# Patient Record
Sex: Female | Born: 1965 | Race: White | Hispanic: No | Marital: Married | Smoking: Never smoker
Health system: Southern US, Community
[De-identification: ages and names within clinical notes are randomized; demographics above are authoritative.]

## PROBLEM LIST (undated history)

## (undated) DIAGNOSIS — I10 Essential (primary) hypertension: Secondary | ICD-10-CM

## (undated) HISTORY — PX: TUBAL LIGATION: SHX77

---

## 1989-11-29 HISTORY — PX: TUBAL LIGATION: SHX77

## 2010-04-20 ENCOUNTER — Emergency Department (HOSPITAL_COMMUNITY): Admission: EM | Admit: 2010-04-20 | Discharge: 2010-04-20 | Payer: Self-pay | Admitting: Emergency Medicine

## 2010-04-30 ENCOUNTER — Encounter (INDEPENDENT_AMBULATORY_CARE_PROVIDER_SITE_OTHER): Payer: Self-pay | Admitting: *Deleted

## 2010-12-31 NOTE — Letter (Signed)
Summary: Appointment - Reschedule  [employer], Main Office  [address]   [place], [place] [address]   Phone: [phone]  Fax: [phone]     April 30, 2010 MRN: [id number]   Angela Vaughan [address] [place], [place]  [address]   Dear [name],   Due to a change in our office schedule, your appointment on 05/05/2010 at 8:15am must be changed.  It is very important that we reach you to reschedule this appointment. We look forward to participating in your health care needs. Please contact us at the number listed above at your earliest convenience to reschedule this appointment.     Sincerely,   [facility] Scheduling Team

## 2011-02-15 LAB — POCT I-STAT, CHEM 8
Calcium, Ion: 1.1 mmol/L — ABNORMAL LOW (ref 1.12–1.32)
Chloride: 105 mEq/L (ref 96–112)
HCT: 33 % — ABNORMAL LOW (ref 36.0–46.0)
Hemoglobin: 11.2 g/dL — ABNORMAL LOW (ref 12.0–15.0)
TCO2: 21 mmol/L (ref 0–100)

## 2011-02-15 LAB — POCT CARDIAC MARKERS

## 2011-03-24 ENCOUNTER — Emergency Department (HOSPITAL_COMMUNITY)
Admission: EM | Admit: 2011-03-24 | Discharge: 2011-03-24 | Disposition: A | Discharge status: Home / Self Care | Payer: Self-pay | Attending: Emergency Medicine | Admitting: Emergency Medicine

## 2011-03-24 ENCOUNTER — Emergency Department (HOSPITAL_COMMUNITY): Payer: Self-pay

## 2011-03-24 DIAGNOSIS — M25539 Pain in unspecified wrist: Secondary | ICD-10-CM | POA: Insufficient documentation

## 2011-03-24 DIAGNOSIS — Y929 Unspecified place or not applicable: Secondary | ICD-10-CM | POA: Insufficient documentation

## 2011-03-24 DIAGNOSIS — S52599A Other fractures of lower end of unspecified radius, initial encounter for closed fracture: Secondary | ICD-10-CM | POA: Insufficient documentation

## 2012-03-05 ENCOUNTER — Encounter (HOSPITAL_COMMUNITY): Payer: Self-pay | Admitting: Emergency Medicine

## 2012-03-05 ENCOUNTER — Emergency Department (HOSPITAL_COMMUNITY)
Admission: EM | Admit: 2012-03-05 | Discharge: 2012-03-05 | Discharge status: Against Medical Advice | Payer: Self-pay | Attending: Emergency Medicine | Admitting: Emergency Medicine

## 2012-03-05 DIAGNOSIS — R109 Unspecified abdominal pain: Secondary | ICD-10-CM | POA: Insufficient documentation

## 2012-03-05 LAB — URINALYSIS, ROUTINE W REFLEX MICROSCOPIC
Glucose, UA: NEGATIVE mg/dL
Protein, ur: NEGATIVE mg/dL
Specific Gravity, Urine: 1.04 — ABNORMAL HIGH (ref 1.005–1.030)

## 2012-03-05 LAB — URINE MICROSCOPIC-ADD ON

## 2012-03-05 NOTE — ED Notes (Signed)
C/o intermittent mid upper abd pain with swelling and tenderness x approx 2 months.  States she has problems digesting food.

## 2012-03-05 NOTE — ED Notes (Addendum)
Attempted to complete vital signs on pt. Called pt several times with no answer. Will continue to monitor.

## 2012-06-26 ENCOUNTER — Encounter (HOSPITAL_COMMUNITY): Payer: Self-pay | Admitting: Neurology

## 2012-06-26 ENCOUNTER — Emergency Department (HOSPITAL_COMMUNITY)
Admission: EM | Admit: 2012-06-26 | Discharge: 2012-06-26 | Disposition: A | Discharge status: Home / Self Care | Payer: Self-pay | Attending: Emergency Medicine | Admitting: Emergency Medicine

## 2012-06-26 DIAGNOSIS — K279 Peptic ulcer, site unspecified, unspecified as acute or chronic, without hemorrhage or perforation: Secondary | ICD-10-CM | POA: Insufficient documentation

## 2012-06-26 DIAGNOSIS — K2970 Gastritis, unspecified, without bleeding: Secondary | ICD-10-CM | POA: Insufficient documentation

## 2012-06-26 DIAGNOSIS — N63 Unspecified lump in unspecified breast: Secondary | ICD-10-CM | POA: Insufficient documentation

## 2012-06-26 MED ORDER — GI COCKTAIL ~~LOC~~
30.0000 mL | Freq: Once | ORAL | Status: AC
  Administered 2012-06-26: 30 mL via ORAL
  Filled 2012-06-26: qty 30

## 2012-06-26 MED ORDER — OXYCODONE-ACETAMINOPHEN 5-325 MG PO TABS: 1.0000 | ORAL_TABLET | Freq: Four times a day (QID) | ORAL | Status: AC | PRN

## 2012-06-26 NOTE — ED Notes (Signed)
Pt c/o LUQ pain x 1 month, constant pain, no relief, nothing relieves pain. Pain has been treating pain with antacids. Pt reporting n/v/d. Pt a x 4. Pt also c/o lump on right breast she would like to have examined. NAD

## 2012-06-26 NOTE — ED Provider Notes (Signed)
History     CSN: [id number]  Arrival date & time 06/26/12  4098   First MD Initiated Contact with Patient 06/26/12 0831      Chief Complaint  Patient presents with  . Abdominal Pain    (Consider location/radiation/quality/duration/timing/severity/associated sxs/prior treatment) Patient is a 46 y.o. female presenting with abdominal pain. The history is provided by the patient.  Abdominal Pain The primary symptoms of the illness include abdominal pain and nausea. The primary symptoms of the illness do not include fever, shortness of breath, vomiting, diarrhea, dysuria or vaginal discharge. Episode onset: One month ago. The onset of the illness was gradual. The problem has been gradually worsening.  The abdominal pain is located in the epigastric region. The abdominal pain does not radiate. The severity of the abdominal pain is 8/10. The abdominal pain is relieved by nothing (Has tried Zantac but no improvement). The abdominal pain is exacerbated by eating.  Associated with: States occasional NSAID and aspirin use. Denies alcohol. The patient states that she believes she is currently not pregnant. The patient has not had a change in bowel habit. Additional symptoms associated with the illness include anorexia and back pain. Symptoms associated with the illness do not include constipation, urgency or frequency. Significant associated medical issues do not include PUD or GERD.    History reviewed. No pertinent past medical history.  Past Surgical History  Procedure Date  . Tubal ligation     No family history on file.  History  Substance Use Topics  . Smoking status: Never Smoker   . Smokeless tobacco: Not on file  . Alcohol Use: No    OB History    Grav Para Term Preterm Abortions TAB SAB Ect Mult Living                  Review of Systems  Constitutional: Negative for fever.  Respiratory: Negative for shortness of breath.        Pain with a knot in the right breast for the  last 6-9 months  Gastrointestinal: Positive for nausea, abdominal pain and anorexia. Negative for vomiting, diarrhea and constipation.  Genitourinary: Negative for dysuria, urgency, frequency and vaginal discharge.  Musculoskeletal: Positive for back pain.  All other systems reviewed and are negative.    Allergies  Review of patient's allergies indicates no known allergies.  Home Medications  No current outpatient prescriptions on file.  BP 139/86  Pulse 80  Temp 98.5 F (36.9 C) (Oral)  Resp 18  SpO2 98%  LMP 06/25/2012  Physical Exam  Nursing note and vitals reviewed. Constitutional: She is oriented to person, place, and time. She appears well-developed and well-nourished. No distress.  HENT:  Head: Normocephalic and atraumatic.  Mouth/Throat: Oropharynx is clear and moist.  Eyes: Conjunctivae and EOM are normal. Pupils are equal, round, and reactive to light.  Neck: Normal range of motion. Neck supple.  Cardiovascular: Normal rate, regular rhythm and intact distal pulses.   No murmur heard. Pulmonary/Chest: Effort normal and breath sounds normal. No respiratory distress. She has no wheezes. She has no rales. Right breast exhibits tenderness.    Abdominal: Soft. Normal appearance. She exhibits no distension. There is tenderness in the epigastric area. There is guarding. There is no rigidity, no rebound, no CVA tenderness, no tenderness at McBurney's point and negative Murphy's sign. No hernia.  Musculoskeletal: Normal range of motion. She exhibits no edema and no tenderness.  Neurological: She is alert and oriented to person, place, and time.  Skin: Skin is warm and dry. No rash noted. No erythema.  Psychiatric: She has a normal mood and affect. Her behavior is normal.    ED Course  Procedures (including critical care time)  Labs Reviewed - No data to display No results found.   1. Gastritis   2. Peptic ulcer disease   3. Breast lump       MDM   Patient  with symptoms classic for gastritis versus peptic ulcer disease. The pain has been worsening over the last month and is only in the epigastric region. On exam she only has epigastric pain with no left upper quadrant or right upper quadrant pain concerning for pancreatitis or cholecystitis. Patient states the pain is worse with eating but that now it's starting to hurt even when not eating. She has been taking Zantac without relief but denies any vomiting or change in bowel habits. She is well-appearing on exam with pain in her epigastric region.  Patient given a GI cocktail but do not feel that labs or imaging is needed at this time. Discussed with patient about starting Prilosec because she has no medical insurance and can't afford more expensive PPIs. She will continue Zantac and start Prilosec. Symptoms were improved after GI cocktail. She was given referral to GI and discussed with her stopping all NSAIDs and aspirin products.  Secondly patient has noticed a knot in her right breast for the last 6-9 months. Occasionally it will become sore and resolved. On exam she has a very small dime-sized mobile lesion.  There are no external signs of abnormalities. Patient was further referred to the breast Center for mammogram  however feel most likely simple cyst.   H.pylori done and will contact the pt tomorrow if positive [phone]   3:11 PM H.pylori neg.  Gwyneth Sprout, MD 06/27/12 1511

## 2013-07-09 ENCOUNTER — Inpatient Hospital Stay (HOSPITAL_COMMUNITY)
Admission: AD | Admit: 2013-07-09 | Discharge: 2013-07-09 | Disposition: A | Discharge status: Home / Self Care | Payer: Self-pay | Source: Ambulatory Visit | Attending: Obstetrics & Gynecology | Admitting: Obstetrics & Gynecology

## 2013-07-09 ENCOUNTER — Encounter (HOSPITAL_COMMUNITY): Payer: Self-pay | Admitting: *Deleted

## 2013-07-09 DIAGNOSIS — R112 Nausea with vomiting, unspecified: Secondary | ICD-10-CM | POA: Insufficient documentation

## 2013-07-09 DIAGNOSIS — N920 Excessive and frequent menstruation with regular cycle: Secondary | ICD-10-CM | POA: Insufficient documentation

## 2013-07-09 DIAGNOSIS — D649 Anemia, unspecified: Secondary | ICD-10-CM | POA: Insufficient documentation

## 2013-07-09 DIAGNOSIS — R1013 Epigastric pain: Secondary | ICD-10-CM | POA: Insufficient documentation

## 2013-07-09 DIAGNOSIS — K219 Gastro-esophageal reflux disease without esophagitis: Secondary | ICD-10-CM | POA: Insufficient documentation

## 2013-07-09 HISTORY — DX: Essential (primary) hypertension: I10

## 2013-07-09 LAB — LIPASE, BLOOD: Lipase: 53 U/L (ref 11–59)

## 2013-07-09 LAB — COMPREHENSIVE METABOLIC PANEL
ALT: 10 U/L (ref 0–35)
AST: 15 U/L (ref 0–37)
Alkaline Phosphatase: 51 U/L (ref 39–117)
CO2: 22 mEq/L (ref 19–32)
Chloride: 105 mEq/L (ref 96–112)
GFR calc non Af Amer: >90 mL/min (ref >90)
Glucose, Bld: 92 mg/dL (ref 70–99)
Potassium: 3.9 mEq/L (ref 3.5–5.1)
Sodium: 135 mEq/L (ref 135–145)
Total Bilirubin: 0.1 mg/dL — ABNORMAL LOW (ref 0.3–1.2)

## 2013-07-09 LAB — CBC
HCT: 27.5 % — ABNORMAL LOW (ref 36.0–46.0)
MCH: 24.3 pg — ABNORMAL LOW (ref 26.0–34.0)
MCHC: 31.6 g/dL (ref 30.0–36.0)
RDW: 13.7 % (ref 11.5–15.5)
WBC: 6.1 10*3/uL (ref 4.0–10.5)

## 2013-07-09 LAB — URINE MICROSCOPIC-ADD ON

## 2013-07-09 LAB — URINALYSIS, ROUTINE W REFLEX MICROSCOPIC
Glucose, UA: NEGATIVE mg/dL
Specific Gravity, Urine: >1.03 — ABNORMAL HIGH (ref 1.005–1.030)
Urobilinogen, UA: 0.2 mg/dL (ref 0.0–1.0)

## 2013-07-09 LAB — POCT PREGNANCY, URINE: Preg Test, Ur: NEGATIVE

## 2013-07-09 MED ORDER — OMEPRAZOLE 20 MG PO CPDR: 20.0000 mg | DELAYED_RELEASE_CAPSULE | Freq: Two times a day (BID) | ORAL | Status: DC

## 2013-07-09 MED ORDER — ONDANSETRON 8 MG PO TBDP
8.0000 mg | ORAL_TABLET | Freq: Once | ORAL | Status: AC
  Administered 2013-07-09: 8 mg via ORAL
  Filled 2013-07-09: qty 1

## 2013-07-09 MED ORDER — DOCUSATE SODIUM 100 MG PO CAPS: 100.0000 mg | ORAL_CAPSULE | Freq: Two times a day (BID) | ORAL | Status: DC

## 2013-07-09 MED ORDER — FERROUS SULFATE 325 (65 FE) MG PO TABS: 325.0000 mg | ORAL_TABLET | Freq: Every day | ORAL | Status: DC

## 2013-07-09 MED ORDER — METOCLOPRAMIDE HCL 10 MG PO TABS
10.0000 mg | ORAL_TABLET | Freq: Once | ORAL | Status: AC
  Administered 2013-07-09: 10 mg via ORAL
  Filled 2013-07-09: qty 1

## 2013-07-09 MED ORDER — GI COCKTAIL ~~LOC~~
30.0000 mL | Freq: Once | ORAL | Status: AC
  Administered 2013-07-09: 30 mL via ORAL
  Filled 2013-07-09: qty 30

## 2013-07-09 NOTE — MAU Provider Note (Signed)
History     CSN: [id number]  Arrival date and time: 07/09/13 1033   First Provider Initiated Contact with Patient 07/09/13 1110      Chief Complaint  Patient presents with  . Abdominal Pain   HPI Ms. Angela Vaughan is a 47 y.o. G2P2001 who presents to MAU today with complaint of epigastric pain. The patient states a history of pelvic ulcer disease x years. She has had N/V yesterday and still nausea this morning. She states that the pain is a burning pain. She denies diarrhea, but has frequent constipation. She denies vaginal bleeding, discharge or fever. She does state a recent history of prolonged heavy periods lasting ~ 10 days each month. She denies weakness, fatigue or dizziness. She is not bleeding today. LMP was 06/15/13. She denies pain medications, but has been taking Zantac and Pepcid frequently as she was told 2 years ago in the [facility] that she could take it as much as she wanted. She rates her pain at 8/10. Patient also states issues with slow digestion. States that she went for an upper endoscopy years ago and was told that there was food in her stomach from the previous week. She denies any history of DM.    OB History   Grav Para Term Preterm Abortions TAB SAB Ect Mult Living   2 2 2       1       Past Medical History  Diagnosis Date  . Hypertension     Past Surgical History  Procedure Laterality Date  . Tubal ligation    . Tubal ligation  1991    History reviewed. No pertinent family history.  History  Substance Use Topics  . Smoking status: Never Smoker   . Smokeless tobacco: Not on file  . Alcohol Use: No    Allergies: No Known Allergies  No prescriptions prior to admission    Review of Systems  Constitutional: Negative for fever and malaise/fatigue.  Gastrointestinal: Positive for heartburn, nausea, vomiting, abdominal pain and constipation. Negative for diarrhea, blood in stool and melena.  Genitourinary: Negative for dysuria, urgency and frequency.       Neg - vaginal bleeding, discharge  Musculoskeletal: Positive for back pain.   Physical Exam   Blood pressure 129/89, pulse 71, temperature 98 F (36.7 C), temperature source Oral, resp. rate 18, height 5\' 5"  (1.651 m), weight 213 lb 12.8 oz (96.979 kg), last menstrual period 06/15/2013.  Physical Exam  Constitutional: She is oriented to person, place, and time. She appears well-developed and well-nourished. No distress.  HENT:  Head: Normocephalic and atraumatic.  Cardiovascular: Normal rate, regular rhythm and normal heart sounds.   Respiratory: Effort normal and breath sounds normal. No respiratory distress.  GI: Soft. Bowel sounds are normal. She exhibits no distension and no mass. There is tenderness (modeate tenderness to palpation of the epigastric region). There is no rebound and no guarding.  Neurological: She is alert and oriented to person, place, and time.  Skin: Skin is warm and dry. No erythema.  Psychiatric: She has a normal mood and affect.   Results for orders placed during the hospital encounter of 07/09/13 (from the past 24 hour(s))  URINALYSIS, ROUTINE W REFLEX MICROSCOPIC     Status: Abnormal   Collection Time    07/09/13 10:50 AM      Result Value Range   Color, Urine YELLOW  YELLOW   APPearance CLEAR  CLEAR   Specific Gravity, Urine >1.030 (*) 1.005 - 1.030  pH 5.5  5.0 - 8.0   Glucose, UA NEGATIVE  NEGATIVE mg/dL   Hgb urine dipstick TRACE (*) NEGATIVE   Bilirubin Urine NEGATIVE  NEGATIVE   Ketones, ur NEGATIVE  NEGATIVE mg/dL   Protein, ur NEGATIVE  NEGATIVE mg/dL   Urobilinogen, UA 0.2  0.0 - 1.0 mg/dL   Nitrite NEGATIVE  NEGATIVE   Leukocytes, UA SMALL (*) NEGATIVE  URINE MICROSCOPIC-ADD ON     Status: Abnormal   Collection Time    07/09/13 10:50 AM      Result Value Range   Squamous Epithelial / LPF RARE  RARE   WBC, UA 7-10  <3 WBC/hpf   Bacteria, UA FEW (*) RARE  POCT PREGNANCY, URINE     Status: None   Collection Time    07/09/13 10:54  AM      Result Value Range   Preg Test, Ur NEGATIVE  NEGATIVE  CBC     Status: Abnormal   Collection Time    07/09/13 11:45 AM      Result Value Range   WBC 6.1  4.0 - 10.5 K/uL   RBC 3.58 (*) 3.87 - 5.11 MIL/uL   Hemoglobin 8.7 (*) 12.0 - 15.0 g/dL   HCT 16.1 (*) 09.6 - 04.5 %   MCV 76.8 (*) 78.0 - 100.0 fL   MCH 24.3 (*) 26.0 - 34.0 pg   MCHC 31.6  30.0 - 36.0 g/dL   RDW 40.9  81.1 - 91.4 %   Platelets 271  150 - 400 K/uL  COMPREHENSIVE METABOLIC PANEL     Status: Abnormal   Collection Time    07/09/13 11:45 AM      Result Value Range   Sodium 135  135 - 145 mEq/L   Potassium 3.9  3.5 - 5.1 mEq/L   Chloride 105  96 - 112 mEq/L   CO2 22  19 - 32 mEq/L   Glucose, Bld 92  70 - 99 mg/dL   BUN 10  6 - 23 mg/dL   Creatinine, Ser 7.82  0.50 - 1.10 mg/dL   Calcium 8.5  8.4 - 95.6 mg/dL   Total Protein 6.1  6.0 - 8.3 g/dL   Albumin 3.0 (*) 3.5 - 5.2 g/dL   AST 15  0 - 37 U/L   ALT 10  0 - 35 U/L   Alkaline Phosphatase 51  39 - 117 U/L   Total Bilirubin 0.1 (*) 0.3 - 1.2 mg/dL   GFR calc non Af Amer >90  >90 mL/min   GFR calc Af Amer >90  >90 mL/min    MAU Course  Procedures None  MDM Reglan, Zofran ODT and GI cocktail given - patient reports significant improvement in symptoms Amylase and Lipase still pending - unlikely to be abnormal with patient's history if resolution of symptoms with treatment today, will call patient for follow-up if results are abnormal  Assessment and Plan  A: GERD Menorrhagia  Anemia  P: Discharge home Rx for prilosec, iron and collace Patient advised to follow-up with PCP for further management Patient referred to GYN clinic for management of menorrhagia. They will call the patient with an appointment Patient may return to MAU as needed, however better to go to [facility] or [facility] if symptoms persist or worsen  Freddi Starr, PA-C  07/09/2013, 3:06 PM

## 2013-07-09 NOTE — MAU Provider Note (Signed)
Attestation of Attending Supervision of Advanced Practitioner (PA/CNM/NP): Evaluation and management procedures were performed by the Advanced Practitioner under my supervision and collaboration.  I have reviewed the Advanced Practitioner's note and chart, and I agree with the management and plan.  Khamiya Varin, MD, FACOG Attending Obstetrician & Gynecologist Faculty Practice, [facility]  

## 2013-07-09 NOTE — MAU Note (Signed)
Epigastric pain "forever," for over a year.  Pain flares up, vomitting.  Severe pain with eating, does not matter what she eats.  Has not been dx'd with gallstones.

## 2013-07-10 LAB — URINE CULTURE

## 2013-08-08 ENCOUNTER — Encounter: Payer: Self-pay | Admitting: Family Medicine

## 2014-05-10 ENCOUNTER — Emergency Department (HOSPITAL_COMMUNITY)
Admission: EM | Admit: 2014-05-10 | Discharge: 2014-05-10 | Disposition: A | Discharge status: Home / Self Care | Payer: Self-pay | Attending: Emergency Medicine | Admitting: Emergency Medicine

## 2014-05-10 ENCOUNTER — Encounter (HOSPITAL_COMMUNITY): Payer: Self-pay | Admitting: Emergency Medicine

## 2014-05-10 DIAGNOSIS — Z3202 Encounter for pregnancy test, result negative: Secondary | ICD-10-CM | POA: Insufficient documentation

## 2014-05-10 DIAGNOSIS — T424X1A Poisoning by benzodiazepines, accidental (unintentional), initial encounter: Secondary | ICD-10-CM | POA: Insufficient documentation

## 2014-05-10 DIAGNOSIS — Y929 Unspecified place or not applicable: Secondary | ICD-10-CM | POA: Insufficient documentation

## 2014-05-10 DIAGNOSIS — D649 Anemia, unspecified: Secondary | ICD-10-CM | POA: Insufficient documentation

## 2014-05-10 DIAGNOSIS — I10 Essential (primary) hypertension: Secondary | ICD-10-CM | POA: Insufficient documentation

## 2014-05-10 DIAGNOSIS — Y939 Activity, unspecified: Secondary | ICD-10-CM | POA: Insufficient documentation

## 2014-05-10 DIAGNOSIS — T40605A Adverse effect of unspecified narcotics, initial encounter: Secondary | ICD-10-CM

## 2014-05-10 DIAGNOSIS — T424X4A Poisoning by benzodiazepines, undetermined, initial encounter: Secondary | ICD-10-CM | POA: Insufficient documentation

## 2014-05-10 DIAGNOSIS — Z8742 Personal history of other diseases of the female genital tract: Secondary | ICD-10-CM | POA: Insufficient documentation

## 2014-05-10 DIAGNOSIS — Z79899 Other long term (current) drug therapy: Secondary | ICD-10-CM | POA: Insufficient documentation

## 2014-05-10 DIAGNOSIS — T400X1A Poisoning by opium, accidental (unintentional), initial encounter: Secondary | ICD-10-CM | POA: Insufficient documentation

## 2014-05-10 LAB — CBC WITH DIFFERENTIAL/PLATELET
BASOS ABS: 0 10*3/uL (ref 0.0–0.1)
Basophils Relative: 0 % (ref 0–1)
EOS PCT: 0 % (ref 0–5)
Eosinophils Absolute: 0 10*3/uL (ref 0.0–0.7)
HEMATOCRIT: 30.5 % — AB (ref 36.0–46.0)
HEMOGLOBIN: 9.7 g/dL — AB (ref 12.0–15.0)
LYMPHS ABS: 1.2 10*3/uL (ref 0.7–4.0)
LYMPHS PCT: 11 % — AB (ref 12–46)
MCH: 22.9 pg — ABNORMAL LOW (ref 26.0–34.0)
MCHC: 31.8 g/dL (ref 30.0–36.0)
MCV: 72.1 fL — AB (ref 78.0–100.0)
MONO ABS: 0.8 10*3/uL (ref 0.1–1.0)
MONOS PCT: 7 % (ref 3–12)
NEUTROS ABS: 8.9 10*3/uL — AB (ref 1.7–7.7)
Neutrophils Relative %: 82 % — ABNORMAL HIGH (ref 43–77)
Platelets: 343 10*3/uL (ref 150–400)
RBC: 4.23 MIL/uL (ref 3.87–5.11)
RDW: 16.1 % — AB (ref 11.5–15.5)
WBC: 10.9 10*3/uL — AB (ref 4.0–10.5)

## 2014-05-10 LAB — POC URINE PREG, ED: Preg Test, Ur: NEGATIVE

## 2014-05-10 NOTE — ED Notes (Signed)
Bed: WA09 Expected date:  Expected time:  Means of arrival:  Comments: ems od

## 2014-05-10 NOTE — ED Provider Notes (Signed)
CSN: [id number]     Arrival date & time 05/10/14  1212 History   First MD Initiated Contact with Patient 05/10/14 1215     Chief Complaint  Patient presents with  . Drug Overdose     (Consider location/radiation/quality/duration/timing/severity/associated sxs/prior Treatment) Patient is a 48 y.o. female presenting with Overdose. [name]  Drug Overdose   patient here after being found unresponsive by her husband. She has been taking him prescribed Xanax for increased stress and anxiety. She took a dose of opiate thinking is to make her feel better. Hasn't found unresponsive and EMS was called and patient blood sugar was 200 and she was given Narcan and she became responsive after the second dose. She denies that this was a suicide attempt. She has no complaints of chest pain or shortness of breath. No abdominal pain. States that she feels overwhelmed by the stress in her life. She continues to deny that this was a suicide attempt.  Past Medical History  Diagnosis Date  . Hypertension    Past Surgical History  Procedure Laterality Date  . Tubal ligation    . Tubal ligation  1991   No family history on file. History  Substance Use Topics  . Smoking status: Never Smoker   . Smokeless tobacco: Not on file  . Alcohol Use: No   OB History   Grav Para Term Preterm Abortions TAB SAB Ect Mult Living   2 2 2       1      Review of Systems  All other systems reviewed and are negative.     Allergies  Review of patient's allergies indicates no known allergies.  Home Medications   Prior to Admission medications   Medication Sig Start Date End Date Taking? Authorizing Provider  citalopram (CELEXA) 40 MG tablet Take 40 mg by mouth daily.    Historical Provider, MD  docusate sodium (COLACE) 100 MG capsule Take 1 capsule (100 mg total) by mouth every 12 (twelve) hours. 07/09/13   Freddi StarrJulie N Ethier, PA-C  ferrous sulfate 325 (65 FE) MG  tablet Take 1 tablet (325 mg total) by mouth daily. 07/09/13   Freddi StarrJulie N Ethier, PA-C  omeprazole (PRILOSEC) 20 MG capsule Take 1 capsule (20 mg total) by mouth 2 (two) times daily. 07/09/13   Freddi StarrJulie N Ethier, PA-C   BP 164/93  Pulse 99  Temp(Src) 98.1 F (36.7 C) (Oral)  Resp 21  SpO2 99%  LMP 05/10/2014 Physical Exam  Nursing note and vitals reviewed. Constitutional: She is oriented to person, place, and time. She appears well-developed and well-nourished.  Non-toxic appearance. No distress.  HENT:  Head: Normocephalic and atraumatic.  Eyes: Conjunctivae, EOM and lids are normal. Pupils are equal, round, and reactive to light.  Neck: Normal range of motion. Neck supple. No tracheal deviation present. No mass present.  Cardiovascular: Normal rate, regular rhythm and normal heart sounds.  Exam reveals no gallop.   No murmur heard. Pulmonary/Chest: Effort normal and breath sounds normal. No stridor. No respiratory distress. She has no decreased breath sounds. She has no wheezes. She has no rhonchi. She has no rales.  Abdominal: Soft. Normal appearance and bowel sounds are normal. She exhibits no distension. There is no tenderness. There is no rebound and no CVA tenderness.  Musculoskeletal: Normal range of motion. She exhibits no edema and no tenderness.  Neurological: She is alert and oriented to person, place, and time. She has normal  strength. No cranial nerve deficit or sensory deficit. GCS eye subscore is 4. GCS verbal subscore is 5. GCS motor subscore is 6.  Skin: Skin is warm and dry. No abrasion and no rash noted.  Psychiatric: She has a normal mood and affect. Her speech is normal and behavior is normal. She expresses no suicidal plans and no homicidal plans.    ED Course  Procedures (including critical care time) Labs Review Labs Reviewed  CBC WITH DIFFERENTIAL - Abnormal; Notable for the following:    WBC 10.9 (*)    Hemoglobin 9.7 (*)    HCT 30.5 (*)    MCV 72.1 (*)    MCH  22.9 (*)    RDW 16.1 (*)    Neutrophils Relative % 82 (*)    Neutro Abs 8.9 (*)    Lymphocytes Relative 11 (*)    All other components within normal limits  POC URINE PREG, ED    Imaging Review No results found.   EKG Interpretation   Date/Time:  Friday May 10 2014 12:16:46 EDT Ventricular Rate:  97 PR Interval:  139 QRS Duration: 75 QT Interval:  361 QTC Calculation: 459 R Axis:   65 Text Interpretation:  Sinus rhythm Borderline T abnormalities, diffuse  leads Confirmed by Freida BusmanALLEN  MD, Leontae Bostock ([id number]) on 05/10/2014 12:42:45 PM      MDM   Final diagnoses:  None   Patient has a known history of dysfunctional uterine bleeding she states this is no different. She does have mild anemia with a hemoglobin of 9.7. She was monitored here and no evidence of rebound opiate effect.     Toy BakerAnthony T Eydan Chianese, MD 05/10/14 [phone]

## 2014-05-10 NOTE — ED Notes (Signed)
Pt. Is unable to use the restroom at this time, but is aware that we need a urine specimen.  

## 2014-05-10 NOTE — Discharge Instructions (Signed)
Narcotic Overdose  A narcotic overdose is the misuse or overuse of a narcotic drug. A narcotic overdose can make you pass out and stop breathing. If you are not treated right away, this can cause permanent brain damage or stop your heart. Medicine may be given to reverse the effects of an overdose. If so, this medicine may bring on withdrawal symptoms. The symptoms may be abdominal cramps, throwing up (vomiting), sweating, chills, and nervousness.  Injecting narcotics can cause more problems than just an overdose. AIDS, hepatitis, and other very serious infections are transmitted by sharing needles and syringes. If you decide to quit using, there are medicines which can help you through the withdrawal period. Trying to quit all at once on your own can be uncomfortable, but not life-threatening. Call your caregiver, Narcotics Anonymous, or any drug and alcohol treatment program for further help.   Document Released: 12/23/2004 Document Revised: 02/07/2012 Document Reviewed: 10/17/2009  [employer] Patient Information 2014 [employer].

## 2014-05-10 NOTE — ED Notes (Signed)
Per EMS pt was found at home by her husband unresponsive, OD on xanax, unknown amount, EMS was called on their arrival pt unresponsive with snoring respirations, 1mg  narcan given intranasally, and 1 mg given IV. Pt on arrival responsive, A+Ox4, tearful, denies SI sts she was justo verwhelmed and  was trying to relax

## 2014-09-30 ENCOUNTER — Encounter (HOSPITAL_COMMUNITY): Payer: Self-pay | Admitting: Emergency Medicine

## 2016-06-15 ENCOUNTER — Inpatient Hospital Stay (HOSPITAL_COMMUNITY)
Admission: EM | Admit: 2016-06-15 | Discharge: 2016-06-18 | DRG: 500 | Disposition: A | Discharge status: Home / Self Care | Payer: Self-pay | Attending: Orthopedic Surgery | Admitting: Orthopedic Surgery

## 2016-06-15 ENCOUNTER — Emergency Department (HOSPITAL_COMMUNITY): Payer: Self-pay | Admitting: Anesthesiology

## 2016-06-15 ENCOUNTER — Encounter (HOSPITAL_COMMUNITY): Payer: Self-pay | Admitting: Emergency Medicine

## 2016-06-15 ENCOUNTER — Emergency Department (HOSPITAL_COMMUNITY): Payer: MEDICAID

## 2016-06-15 ENCOUNTER — Encounter (HOSPITAL_COMMUNITY)
Admission: EM | Disposition: A | Discharge status: Home / Self Care | Payer: Self-pay | Source: Home / Self Care | Attending: Orthopedic Surgery

## 2016-06-15 ENCOUNTER — Emergency Department (HOSPITAL_COMMUNITY): Payer: MEDICAID | Admitting: Anesthesiology

## 2016-06-15 ENCOUNTER — Emergency Department (HOSPITAL_COMMUNITY): Payer: Self-pay

## 2016-06-15 DIAGNOSIS — W132XXA Fall from, out of or through roof, initial encounter: Secondary | ICD-10-CM | POA: Diagnosis present

## 2016-06-15 DIAGNOSIS — R000 Tachycardia, unspecified: Secondary | ICD-10-CM | POA: Insufficient documentation

## 2016-06-15 DIAGNOSIS — D62 Acute posthemorrhagic anemia: Secondary | ICD-10-CM | POA: Diagnosis not present

## 2016-06-15 DIAGNOSIS — W19XXXA Unspecified fall, initial encounter: Secondary | ICD-10-CM

## 2016-06-15 DIAGNOSIS — J9621 Acute and chronic respiratory failure with hypoxia: Secondary | ICD-10-CM | POA: Diagnosis present

## 2016-06-15 DIAGNOSIS — I469 Cardiac arrest, cause unspecified: Secondary | ICD-10-CM | POA: Diagnosis not present

## 2016-06-15 DIAGNOSIS — S52202C Unspecified fracture of shaft of left ulna, initial encounter for open fracture type IIIA, IIIB, or IIIC: Secondary | ICD-10-CM | POA: Diagnosis present

## 2016-06-15 DIAGNOSIS — Z8781 Personal history of (healed) traumatic fracture: Secondary | ICD-10-CM

## 2016-06-15 DIAGNOSIS — I10 Essential (primary) hypertension: Secondary | ICD-10-CM | POA: Diagnosis present

## 2016-06-15 DIAGNOSIS — M4854XA Collapsed vertebra, not elsewhere classified, thoracic region, initial encounter for fracture: Secondary | ICD-10-CM | POA: Diagnosis present

## 2016-06-15 DIAGNOSIS — S5292XB Unspecified fracture of left forearm, initial encounter for open fracture type I or II: Secondary | ICD-10-CM | POA: Diagnosis present

## 2016-06-15 DIAGNOSIS — Z9889 Other specified postprocedural states: Secondary | ICD-10-CM

## 2016-06-15 DIAGNOSIS — S22080A Wedge compression fracture of T11-T12 vertebra, initial encounter for closed fracture: Secondary | ICD-10-CM

## 2016-06-15 DIAGNOSIS — R4189 Other symptoms and signs involving cognitive functions and awareness: Secondary | ICD-10-CM | POA: Insufficient documentation

## 2016-06-15 DIAGNOSIS — S55012A Laceration of ulnar artery at forearm level, left arm, initial encounter: Secondary | ICD-10-CM | POA: Diagnosis present

## 2016-06-15 DIAGNOSIS — S42302B Unspecified fracture of shaft of humerus, left arm, initial encounter for open fracture: Secondary | ICD-10-CM

## 2016-06-15 DIAGNOSIS — S52502C Unspecified fracture of the lower end of left radius, initial encounter for open fracture type IIIA, IIIB, or IIIC: Principal | ICD-10-CM | POA: Diagnosis present

## 2016-06-15 DIAGNOSIS — I248 Other forms of acute ischemic heart disease: Secondary | ICD-10-CM | POA: Diagnosis present

## 2016-06-15 HISTORY — PX: OPEN REDUCTION INTERNAL FIXATION (ORIF) DISTAL RADIAL FRACTURE: SHX5989

## 2016-06-15 LAB — COMPREHENSIVE METABOLIC PANEL
ALK PHOS: 48 U/L (ref 38–126)
ALT: 18 U/L (ref 14–54)
AST: 27 U/L (ref 15–41)
Albumin: 3.6 g/dL (ref 3.5–5.0)
Anion gap: 8 (ref 5–15)
BUN: 20 mg/dL (ref 6–20)
CALCIUM: 9.5 mg/dL (ref 8.9–10.3)
CO2: 25 mmol/L (ref 22–32)
CREATININE: 0.87 mg/dL (ref 0.44–1.00)
Chloride: 106 mmol/L (ref 101–111)
Glucose, Bld: 122 mg/dL — ABNORMAL HIGH (ref 65–99)
Potassium: 4.3 mmol/L (ref 3.5–5.1)
Sodium: 139 mmol/L (ref 135–145)
Total Bilirubin: 0.3 mg/dL (ref 0.3–1.2)
Total Protein: 6.4 g/dL — ABNORMAL LOW (ref 6.5–8.1)

## 2016-06-15 LAB — I-STAT CHEM 8, ED
BUN: 25 mg/dL — AB (ref 6–20)
CHLORIDE: 104 mmol/L (ref 101–111)
CREATININE: 0.9 mg/dL (ref 0.44–1.00)
Calcium, Ion: 1.19 mmol/L (ref 1.13–1.30)
GLUCOSE: 123 mg/dL — AB (ref 65–99)
HCT: 36 % (ref 36.0–46.0)
Hemoglobin: 12.2 g/dL (ref 12.0–15.0)
POTASSIUM: 4.2 mmol/L (ref 3.5–5.1)
Sodium: 140 mmol/L (ref 135–145)
TCO2: 27 mmol/L (ref 0–100)

## 2016-06-15 LAB — URINALYSIS, ROUTINE W REFLEX MICROSCOPIC
Bilirubin Urine: NEGATIVE
GLUCOSE, UA: NEGATIVE mg/dL
KETONES UR: NEGATIVE mg/dL
LEUKOCYTES UA: NEGATIVE
Nitrite: NEGATIVE
PROTEIN: NEGATIVE mg/dL
Specific Gravity, Urine: <1.005 — ABNORMAL LOW (ref 1.005–1.030)
pH: 5 (ref 5.0–8.0)

## 2016-06-15 LAB — CBC
HCT: 35.7 % — ABNORMAL LOW (ref 36.0–46.0)
Hemoglobin: 12 g/dL (ref 12.0–15.0)
MCH: 29.4 pg (ref 26.0–34.0)
MCHC: 33.6 g/dL (ref 30.0–36.0)
MCV: 87.5 fL (ref 78.0–100.0)
PLATELETS: 345 10*3/uL (ref 150–400)
RBC: 4.08 MIL/uL (ref 3.87–5.11)
RDW: 12.6 % (ref 11.5–15.5)
WBC: 9.9 10*3/uL (ref 4.0–10.5)

## 2016-06-15 LAB — URINE MICROSCOPIC-ADD ON
SQUAMOUS EPITHELIAL / LPF: NONE SEEN
WBC UA: NONE SEEN WBC/hpf (ref 0–5)

## 2016-06-15 LAB — PROTIME-INR
INR: 1.01 (ref 0.00–1.49)
Prothrombin Time: 13.5 seconds (ref 11.6–15.2)

## 2016-06-15 LAB — I-STAT BETA HCG BLOOD, ED (MC, WL, AP ONLY)

## 2016-06-15 LAB — I-STAT CG4 LACTIC ACID, ED: LACTIC ACID, VENOUS: 1.62 mmol/L (ref 0.5–1.9)

## 2016-06-15 LAB — SAMPLE TO BLOOD BANK

## 2016-06-15 SURGERY — OPEN REDUCTION INTERNAL FIXATION (ORIF) DISTAL RADIUS FRACTURE
Anesthesia: Regional | Laterality: Left

## 2016-06-15 MED ORDER — GENTAMICIN SULFATE 40 MG/ML IJ SOLN
500.0000 mg | INTRAVENOUS | Status: DC
  Administered 2016-06-16 – 2016-06-18 (×3): 500 mg via INTRAVENOUS
  Filled 2016-06-15 (×4): qty 12.5

## 2016-06-15 MED ORDER — MORPHINE SULFATE (PF) 2 MG/ML IV SOLN
2.0000 mg | Freq: Once | INTRAVENOUS | Status: AC
  Administered 2016-06-15: 2 mg via INTRAVENOUS
  Filled 2016-06-15: qty 1

## 2016-06-15 MED ORDER — ONDANSETRON HCL 4 MG/2ML IJ SOLN: 4.0000 mg | Freq: Once | INTRAMUSCULAR | Status: DC | PRN

## 2016-06-15 MED ORDER — CEFAZOLIN IN D5W 1 GM/50ML IV SOLN
1.0000 g | INTRAVENOUS | Status: AC
  Administered 2016-06-16: 1 g via INTRAVENOUS
  Filled 2016-06-15: qty 50

## 2016-06-15 MED ORDER — LIDOCAINE HCL 1 % IJ SOLN
INTRAMUSCULAR | Status: DC | PRN
  Administered 2016-06-15: 15 mL

## 2016-06-15 MED ORDER — VITAMIN C 500 MG PO TABS
1000.0000 mg | ORAL_TABLET | Freq: Every day | ORAL | Status: DC
  Administered 2016-06-17: 1000 mg via ORAL
  Filled 2016-06-15 (×2): qty 2

## 2016-06-15 MED ORDER — OXYCODONE HCL 5 MG/5ML PO SOLN: 5.0000 mg | Freq: Once | ORAL | Status: AC | PRN

## 2016-06-15 MED ORDER — ONDANSETRON HCL 4 MG/2ML IJ SOLN: 4.0000 mg | Freq: Four times a day (QID) | INTRAMUSCULAR | Status: DC | PRN

## 2016-06-15 MED ORDER — IOPAMIDOL (ISOVUE-300) INJECTION 61%
INTRAVENOUS | Status: AC
  Administered 2016-06-15: 100 mL
  Filled 2016-06-15: qty 100

## 2016-06-15 MED ORDER — ALPRAZOLAM 0.5 MG PO TABS
0.5000 mg | ORAL_TABLET | Freq: Four times a day (QID) | ORAL | Status: DC | PRN
  Administered 2016-06-15: 0.5 mg via ORAL
  Filled 2016-06-15: qty 1

## 2016-06-15 MED ORDER — MIDAZOLAM HCL 2 MG/2ML IJ SOLN
INTRAMUSCULAR | Status: AC
  Filled 2016-06-15: qty 2

## 2016-06-15 MED ORDER — LACTATED RINGERS IV SOLN
INTRAVENOUS | Status: DC
  Administered 2016-06-15 (×3): via INTRAVENOUS

## 2016-06-15 MED ORDER — FAMOTIDINE 20 MG PO TABS: 20.0000 mg | ORAL_TABLET | Freq: Two times a day (BID) | ORAL | Status: DC | PRN

## 2016-06-15 MED ORDER — ONDANSETRON HCL 4 MG/2ML IJ SOLN
INTRAMUSCULAR | Status: AC
  Filled 2016-06-15: qty 2

## 2016-06-15 MED ORDER — FENTANYL CITRATE (PF) 250 MCG/5ML IJ SOLN
INTRAMUSCULAR | Status: AC
  Filled 2016-06-15: qty 5

## 2016-06-15 MED ORDER — OXYCODONE HCL 5 MG PO TABS
ORAL_TABLET | ORAL | Status: AC
  Filled 2016-06-15: qty 2

## 2016-06-15 MED ORDER — METHOCARBAMOL 500 MG PO TABS
500.0000 mg | ORAL_TABLET | Freq: Four times a day (QID) | ORAL | Status: DC | PRN
  Administered 2016-06-15 – 2016-06-17 (×4): 500 mg via ORAL
  Filled 2016-06-15 (×5): qty 1

## 2016-06-15 MED ORDER — PHENYLEPHRINE HCL 10 MG/ML IJ SOLN
INTRAMUSCULAR | Status: DC | PRN
  Administered 2016-06-15: 120 ug via INTRAVENOUS
  Administered 2016-06-15: 80 ug via INTRAVENOUS

## 2016-06-15 MED ORDER — METHOCARBAMOL 1000 MG/10ML IJ SOLN: 500.0000 mg | Freq: Four times a day (QID) | INTRAMUSCULAR | Status: DC | PRN

## 2016-06-15 MED ORDER — HYDROMORPHONE HCL 1 MG/ML IJ SOLN
1.0000 mg | Freq: Once | INTRAMUSCULAR | Status: AC
  Administered 2016-06-15: 1 mg via INTRAVENOUS
  Filled 2016-06-15: qty 1

## 2016-06-15 MED ORDER — LIDOCAINE HCL (PF) 1 % IJ SOLN
INTRAMUSCULAR | Status: AC
  Filled 2016-06-15: qty 30

## 2016-06-15 MED ORDER — MIDAZOLAM HCL 5 MG/5ML IJ SOLN
INTRAMUSCULAR | Status: DC | PRN
  Administered 2016-06-15: 1 mg via INTRAVENOUS

## 2016-06-15 MED ORDER — PENICILLIN G POTASSIUM 5000000 UNITS IJ SOLR
4.0000 10*6.[IU] | INTRAVENOUS | Status: DC
  Administered 2016-06-16 (×4): 4 10*6.[IU] via INTRAVENOUS
  Filled 2016-06-15 (×7): qty 4

## 2016-06-15 MED ORDER — CEFAZOLIN IN D5W 1 GM/50ML IV SOLN
1.0000 g | Freq: Three times a day (TID) | INTRAVENOUS | Status: DC
Start: 2016-06-16 — End: 2016-06-18
  Administered 2016-06-16 – 2016-06-18 (×8): 1 g via INTRAVENOUS
  Filled 2016-06-15 (×10): qty 50

## 2016-06-15 MED ORDER — BUPIVACAINE HCL (PF) 0.25 % IJ SOLN
INTRAMUSCULAR | Status: AC
  Filled 2016-06-15: qty 30

## 2016-06-15 MED ORDER — METHOCARBAMOL 1000 MG/10ML IJ SOLN
500.0000 mg | Freq: Four times a day (QID) | INTRAVENOUS | Status: DC | PRN
  Filled 2016-06-15: qty 5

## 2016-06-15 MED ORDER — ONDANSETRON HCL 4 MG/2ML IJ SOLN
INTRAMUSCULAR | Status: DC | PRN
  Administered 2016-06-15: 4 mg via INTRAVENOUS

## 2016-06-15 MED ORDER — ROCURONIUM BROMIDE 100 MG/10ML IV SOLN
INTRAVENOUS | Status: DC | PRN
  Administered 2016-06-15: 50 mg via INTRAVENOUS

## 2016-06-15 MED ORDER — FENTANYL CITRATE (PF) 100 MCG/2ML IJ SOLN
INTRAMUSCULAR | Status: AC
  Filled 2016-06-15: qty 2

## 2016-06-15 MED ORDER — OXYCODONE HCL 5 MG PO TABS
5.0000 mg | ORAL_TABLET | Freq: Once | ORAL | Status: AC | PRN
  Administered 2016-06-15: 5 mg via ORAL

## 2016-06-15 MED ORDER — FENTANYL CITRATE (PF) 100 MCG/2ML IJ SOLN
100.0000 ug | Freq: Once | INTRAMUSCULAR | Status: AC
  Administered 2016-06-15: 100 ug via INTRAVENOUS
  Filled 2016-06-15: qty 2

## 2016-06-15 MED ORDER — CEFAZOLIN SODIUM-DEXTROSE 2-4 GM/100ML-% IV SOLN
INTRAVENOUS | Status: AC
  Administered 2016-06-15: 2 g via INTRAVENOUS
  Filled 2016-06-15: qty 100

## 2016-06-15 MED ORDER — LACTATED RINGERS IV SOLN
INTRAVENOUS | Status: DC
  Administered 2016-06-15 – 2016-06-16 (×2): via INTRAVENOUS

## 2016-06-15 MED ORDER — ONDANSETRON HCL 4 MG/2ML IJ SOLN
4.0000 mg | Freq: Once | INTRAMUSCULAR | Status: AC
  Administered 2016-06-15: 4 mg via INTRAVENOUS
  Filled 2016-06-15: qty 2

## 2016-06-15 MED ORDER — CEFAZOLIN IN D5W 1 GM/50ML IV SOLN
1.0000 g | Freq: Once | INTRAVENOUS | Status: AC
  Administered 2016-06-15: 1 g via INTRAVENOUS
  Filled 2016-06-15: qty 50

## 2016-06-15 MED ORDER — MORPHINE SULFATE (PF) 2 MG/ML IV SOLN
INTRAVENOUS | Status: AC
  Filled 2016-06-15: qty 1

## 2016-06-15 MED ORDER — OXYCODONE HCL 5 MG PO TABS
5.0000 mg | ORAL_TABLET | ORAL | Status: DC | PRN
  Administered 2016-06-16: 10 mg via ORAL
  Administered 2016-06-16: 5 mg via ORAL
  Administered 2016-06-16: 10 mg via ORAL
  Filled 2016-06-15 (×3): qty 2

## 2016-06-15 MED ORDER — TETANUS-DIPHTHERIA TOXOIDS TD 5-2 LFU IM INJ
0.5000 mL | INJECTION | Freq: Once | INTRAMUSCULAR | Status: DC
  Filled 2016-06-15: qty 0.5

## 2016-06-15 MED ORDER — GENTAMICIN IN SALINE 1.6-0.9 MG/ML-% IV SOLN
INTRAVENOUS | Status: AC
  Administered 2016-06-15: 120 mg via INTRAVENOUS
  Filled 2016-06-15: qty 100

## 2016-06-15 MED ORDER — METHOCARBAMOL 500 MG PO TABS
ORAL_TABLET | ORAL | Status: AC
  Filled 2016-06-15: qty 1

## 2016-06-15 MED ORDER — PROPOFOL 10 MG/ML IV BOLUS
INTRAVENOUS | Status: AC
  Filled 2016-06-15: qty 20

## 2016-06-15 MED ORDER — FENTANYL CITRATE (PF) 100 MCG/2ML IJ SOLN
INTRAMUSCULAR | Status: AC
  Administered 2016-06-15: 100 ug
  Filled 2016-06-15: qty 2

## 2016-06-15 MED ORDER — PROMETHAZINE HCL 25 MG RE SUPP: 12.5000 mg | Freq: Four times a day (QID) | RECTAL | Status: DC | PRN

## 2016-06-15 MED ORDER — MORPHINE SULFATE (PF) 4 MG/ML IV SOLN
4.0000 mg | INTRAVENOUS | Status: DC | PRN
  Administered 2016-06-15: 4 mg via INTRAVENOUS
  Filled 2016-06-15: qty 1

## 2016-06-15 MED ORDER — ONDANSETRON HCL 4 MG/2ML IJ SOLN
4.0000 mg | Freq: Four times a day (QID) | INTRAMUSCULAR | Status: DC | PRN
  Administered 2016-06-15: 4 mg via INTRAVENOUS
  Filled 2016-06-15: qty 2

## 2016-06-15 MED ORDER — LIDOCAINE 2% (20 MG/ML) 5 ML SYRINGE
INTRAMUSCULAR | Status: AC
  Filled 2016-06-15: qty 5

## 2016-06-15 MED ORDER — MORPHINE SULFATE (PF) 2 MG/ML IV SOLN
1.0000 mg | INTRAVENOUS | Status: DC | PRN
  Administered 2016-06-15 – 2016-06-16 (×4): 1 mg via INTRAVENOUS
  Filled 2016-06-15 (×2): qty 1
  Filled 2016-06-15: qty 0.5
  Filled 2016-06-15: qty 1

## 2016-06-15 MED ORDER — FENTANYL CITRATE (PF) 100 MCG/2ML IJ SOLN
25.0000 ug | INTRAMUSCULAR | Status: DC | PRN
  Administered 2016-06-15 (×3): 50 ug via INTRAVENOUS

## 2016-06-15 MED ORDER — DOCUSATE SODIUM 100 MG PO CAPS
100.0000 mg | ORAL_CAPSULE | Freq: Two times a day (BID) | ORAL | Status: DC
  Administered 2016-06-16 – 2016-06-17 (×4): 100 mg via ORAL
  Filled 2016-06-15 (×5): qty 1

## 2016-06-15 MED ORDER — LIDOCAINE-EPINEPHRINE 1 %-1:100000 IJ SOLN
INTRAMUSCULAR | Status: AC
  Filled 2016-06-15: qty 1

## 2016-06-15 MED ORDER — 0.9 % SODIUM CHLORIDE (POUR BTL) OPTIME
TOPICAL | Status: DC | PRN
  Administered 2016-06-15: 3000 mL
  Administered 2016-06-15: 1000 mL

## 2016-06-15 MED ORDER — PROPOFOL 10 MG/ML IV BOLUS
INTRAVENOUS | Status: DC | PRN
  Administered 2016-06-15: 160 mg via INTRAVENOUS

## 2016-06-15 MED ORDER — PHENYLEPHRINE 40 MCG/ML (10ML) SYRINGE FOR IV PUSH (FOR BLOOD PRESSURE SUPPORT)
PREFILLED_SYRINGE | INTRAVENOUS | Status: AC
  Filled 2016-06-15: qty 30

## 2016-06-15 MED ORDER — LIDOCAINE HCL (CARDIAC) 20 MG/ML IV SOLN
INTRAVENOUS | Status: DC | PRN
  Administered 2016-06-15: 30 mg via INTRAVENOUS

## 2016-06-15 MED ORDER — DEXAMETHASONE SODIUM PHOSPHATE 10 MG/ML IJ SOLN: INTRAMUSCULAR | Status: DC | PRN

## 2016-06-15 MED ORDER — ROCURONIUM BROMIDE 50 MG/5ML IV SOLN
INTRAVENOUS | Status: AC
  Filled 2016-06-15: qty 1

## 2016-06-15 MED ORDER — ONDANSETRON HCL 4 MG PO TABS: 4.0000 mg | ORAL_TABLET | Freq: Four times a day (QID) | ORAL | Status: DC | PRN

## 2016-06-15 MED ORDER — OXYCODONE HCL 5 MG PO TABS
ORAL_TABLET | ORAL | Status: AC
  Filled 2016-06-15: qty 1

## 2016-06-15 MED ORDER — SUGAMMADEX SODIUM 200 MG/2ML IV SOLN
INTRAVENOUS | Status: DC | PRN
  Administered 2016-06-15: 200 mg via INTRAVENOUS

## 2016-06-15 MED ORDER — TETANUS-DIPHTH-ACELL PERTUSSIS 5-2.5-18.5 LF-MCG/0.5 IM SUSP
0.5000 mL | Freq: Once | INTRAMUSCULAR | Status: AC
  Administered 2016-06-15: 0.5 mL via INTRAMUSCULAR
  Filled 2016-06-15: qty 0.5

## 2016-06-15 MED ORDER — METHOCARBAMOL 1000 MG/10ML IJ SOLN
500.0000 mg | Freq: Four times a day (QID) | INTRAVENOUS | Status: DC | PRN
  Administered 2016-06-15: 500 g via INTRAVENOUS
  Filled 2016-06-15 (×2): qty 5

## 2016-06-15 MED ORDER — PHENYLEPHRINE HCL 10 MG/ML IJ SOLN
10.0000 mg | INTRAVENOUS | Status: DC | PRN
  Administered 2016-06-15: 20 ug/min via INTRAVENOUS

## 2016-06-15 MED ORDER — ALPRAZOLAM 0.5 MG PO TABS: 1.0000 mg | ORAL_TABLET | Freq: Three times a day (TID) | ORAL | Status: DC | PRN

## 2016-06-15 MED ORDER — FENTANYL CITRATE (PF) 100 MCG/2ML IJ SOLN
INTRAMUSCULAR | Status: DC | PRN
  Administered 2016-06-15: 100 ug via INTRAVENOUS

## 2016-06-15 MED ORDER — SODIUM CHLORIDE 0.9 % IV BOLUS (SEPSIS)
1000.0000 mL | Freq: Once | INTRAVENOUS | Status: AC
  Administered 2016-06-15: 1000 mL via INTRAVENOUS

## 2016-06-15 SURGICAL SUPPLY — 72 items
BANDAGE ELASTIC 3 VELCRO ST LF (GAUZE/BANDAGES/DRESSINGS) ×3 IMPLANT
BANDAGE ELASTIC 4 VELCRO ST LF (GAUZE/BANDAGES/DRESSINGS) ×3 IMPLANT
BIT DRILL 2.2 SS TIBIAL (BIT) ×4 IMPLANT
BIT DRILL 2.5X2.75 QC CALB (BIT) ×2 IMPLANT
BIT DRILL CALIBRATED 2.7 (BIT) ×1 IMPLANT
BIT DRILL CALIBRATED 2.7MM (BIT) ×1
BLADE SURG ROTATE 9660 (MISCELLANEOUS) IMPLANT
BNDG CMPR 9X4 STRL LF SNTH (GAUZE/BANDAGES/DRESSINGS) ×1
BNDG CONFORM 3 STRL LF (GAUZE/BANDAGES/DRESSINGS) ×2 IMPLANT
BNDG ESMARK 4X9 LF (GAUZE/BANDAGES/DRESSINGS) ×3 IMPLANT
BNDG GAUZE ELAST 4 BULKY (GAUZE/BANDAGES/DRESSINGS) ×5 IMPLANT
CORDS BIPOLAR (ELECTRODE) ×3 IMPLANT
COVER SURGICAL LIGHT HANDLE (MISCELLANEOUS) ×3 IMPLANT
CUFF TOURNIQUET SINGLE 18IN (TOURNIQUET CUFF) ×3 IMPLANT
DECANTER SPIKE VIAL GLASS SM (MISCELLANEOUS) IMPLANT
DRAIN TLS ROUND 10FR (DRAIN) ×4 IMPLANT
DRAPE OEC MINIVIEW 54X84 (DRAPES) ×2 IMPLANT
DRAPE U-SHAPE 47X51 STRL (DRAPES) ×3 IMPLANT
DRSG ADAPTIC 3X8 NADH LF (GAUZE/BANDAGES/DRESSINGS) ×3 IMPLANT
FLUID NSS /IRRIG 3000 ML XXX (IV SOLUTION) ×10 IMPLANT
GAUZE SPONGE 4X4 12PLY STRL (GAUZE/BANDAGES/DRESSINGS) ×3 IMPLANT
GAUZE XEROFORM 5X9 LF (GAUZE/BANDAGES/DRESSINGS) ×3 IMPLANT
GLOVE BIOGEL M 8.0 STRL (GLOVE) ×3 IMPLANT
GLOVE SS BIOGEL STRL SZ 8 (GLOVE) ×1 IMPLANT
GLOVE SUPERSENSE BIOGEL SZ 8 (GLOVE) ×2
GOWN STRL REUS W/ TWL LRG LVL3 (GOWN DISPOSABLE) ×3 IMPLANT
GOWN STRL REUS W/ TWL XL LVL3 (GOWN DISPOSABLE) ×3 IMPLANT
GOWN STRL REUS W/TWL LRG LVL3 (GOWN DISPOSABLE) ×9
GOWN STRL REUS W/TWL XL LVL3 (GOWN DISPOSABLE) ×9
KIT BASIN OR (CUSTOM PROCEDURE TRAY) ×3 IMPLANT
KIT ROOM TURNOVER OR (KITS) ×3 IMPLANT
LOOP VESSEL MAXI BLUE (MISCELLANEOUS) ×2 IMPLANT
MANIFOLD NEPTUNE II (INSTRUMENTS) ×3 IMPLANT
NEEDLE 22X1 1/2 (OR ONLY) (NEEDLE) IMPLANT
NS IRRIG 1000ML POUR BTL (IV SOLUTION) ×3 IMPLANT
PACK ORTHO EXTREMITY (CUSTOM PROCEDURE TRAY) ×3 IMPLANT
PAD ARMBOARD 7.5X6 YLW CONV (MISCELLANEOUS) ×6 IMPLANT
PAD CAST 4YDX4 CTTN HI CHSV (CAST SUPPLIES) ×1 IMPLANT
PADDING CAST COTTON 4X4 STRL (CAST SUPPLIES) ×3
PEG LOCKING SMOOTH 2.2X20 (Screw) ×2 IMPLANT
PILLOW ARM CARTER ADULT (MISCELLANEOUS) ×2 IMPLANT
PLATE LOCK COMP 7H FOOT (Plate) ×2 IMPLANT
PLATE NARROW DVR LEFT (Plate) ×2 IMPLANT
SCREW CORTICAL 3.5MM  12MM (Screw) ×2 IMPLANT
SCREW CORTICAL 3.5MM 12MM (Screw) IMPLANT
SCREW CORTICAL 3.5MM 14MM (Screw) ×2 IMPLANT
SCREW LOCK 12X2.7X 3 LD (Screw) IMPLANT
SCREW LOCK 14X2.7X 3 LD TPR (Screw) IMPLANT
SCREW LOCK 16X2.7X 3 LD TPR (Screw) IMPLANT
SCREW LOCK CORT STAR 3.5X10 (Screw) ×8 IMPLANT
SCREW LOCKING 2.7X12MM (Screw) ×6 IMPLANT
SCREW LOCKING 2.7X13MM (Screw) ×2 IMPLANT
SCREW LOCKING 2.7X14 (Screw) ×6 IMPLANT
SCREW LOCKING 2.7X16 (Screw) ×3 IMPLANT
SCREW MULTI DIRECTIONAL 2.7X18 (Screw) ×4 IMPLANT
SCREW MULTI DIRECTIONAL 2.7X20 (Screw) ×4 IMPLANT
SPEAR EYE SURG WECK-CEL (MISCELLANEOUS) ×2 IMPLANT
SPLINT FIBERGLASS 4X30 (CAST SUPPLIES) ×2 IMPLANT
SPONGE LAP 4X18 X RAY DECT (DISPOSABLE) IMPLANT
SUCTION FRAZIER HANDLE 10FR (MISCELLANEOUS) ×2
SUCTION TUBE FRAZIER 10FR DISP (MISCELLANEOUS) IMPLANT
SUT ETHILON 8 0 BV130 4 (SUTURE) ×4 IMPLANT
SUT MNCRL AB 4-0 PS2 18 (SUTURE) ×5 IMPLANT
SUT PROLENE 3 0 PS 2 (SUTURE) ×12 IMPLANT
SYR CONTROL 10ML LL (SYRINGE) IMPLANT
TOWEL OR 17X24 6PK STRL BLUE (TOWEL DISPOSABLE) ×5 IMPLANT
TOWEL OR 17X26 10 PK STRL BLUE (TOWEL DISPOSABLE) ×3 IMPLANT
TUBE CONNECTING 12'X1/4 (SUCTIONS) ×1
TUBE CONNECTING 12X1/4 (SUCTIONS) ×2 IMPLANT
TUBE EVACUATION TLS (MISCELLANEOUS) ×3 IMPLANT
UNDERPAD 30X30 INCONTINENT (UNDERPADS AND DIAPERS) ×3 IMPLANT
WATER STERILE IRR 1000ML POUR (IV SOLUTION) ×1 IMPLANT

## 2016-06-15 NOTE — Progress Notes (Signed)
Ear ring removed from right ear and given to husband.

## 2016-06-15 NOTE — Progress Notes (Signed)
Paged Dr. Bevely Palmeritty to Dr. Noreene LarssonJoslin.

## 2016-06-15 NOTE — Transfer of Care (Signed)
Immediate Anesthesia Transfer of Care Note  Patient: Angela Vaughan  Procedure(s) Performed: Procedure(s): OPEN REDUCTION INTERNAL FIXATION (ORIF) DISTAL RADIAL FRACTUREAND ULNA (Left)  Patient Location: PACU  Anesthesia Type:GA combined with regional for post-op pain  Level of Consciousness: patient cooperative and responds to stimulation, drowsy  Airway & Oxygen Therapy: Patient Spontanous Breathing and Patient connected to nasal cannula oxygen  Post-op Assessment: Report given to RN and Post -op Vital signs reviewed and stable  Post vital signs: Reviewed and stable  Last Vitals:  Filed Vitals:   06/15/16 1457 06/15/16 1500  BP: 129/78 126/76  Pulse: 93 97  Resp: 18     Last Pain:  Filed Vitals:   06/15/16 1511  PainSc: 6          Complications: No apparent anesthesia complications

## 2016-06-15 NOTE — Anesthesia Postprocedure Evaluation (Signed)
Anesthesia Post Note  Patient: [name]  Procedure(s) Performed: Procedure(s) (LRB): OPEN REDUCTION INTERNAL FIXATION (ORIF) DISTAL RADIAL FRACTUREAND ULNA (Left)  Patient location during evaluation: PACU Anesthesia Type: General Level of consciousness: awake Pain management: pain level controlled Vital Signs Assessment: post-procedure vital signs reviewed and stable Respiratory status: spontaneous breathing Cardiovascular status: stable Anesthetic complications: no    Last Vitals:  Filed Vitals:   06/15/16 1500 06/15/16 2038  BP: 126/76 118/72  Pulse: 97 80  Temp:  36.5 C  Resp:  16    Last Pain:  Filed Vitals:   06/15/16 2050  PainSc: 8                  EDWARDS,Nyzier Boivin

## 2016-06-15 NOTE — Anesthesia Preprocedure Evaluation (Signed)
Anesthesia Evaluation  Patient identified by MRN, date of birth, ID band Patient awake    Reviewed: Allergy & Precautions, NPO status , Patient's Chart, lab work & pertinent test results  Airway Mallampati: II  TM Distance: >3 FB Neck ROM: Full    Dental  (+) Teeth Intact, Dental Advisory Given   Pulmonary    breath sounds clear to auscultation       Cardiovascular hypertension,  Rhythm:Regular Rate:Normal     Neuro/Psych    GI/Hepatic   Endo/Other    Renal/GU      Musculoskeletal   Abdominal (+) + obese,   Peds  Hematology   Anesthesia Other Findings   Reproductive/Obstetrics                             Anesthesia Physical Anesthesia Plan  ASA: III  Anesthesia Plan: General and Regional   Post-op Pain Management:    Induction: Intravenous  Airway Management Planned: Oral ETT  Additional Equipment:   Intra-op Plan:   Post-operative Plan: Extubation in OR  Informed Consent: I have reviewed the patients History and Physical, chart, labs and discussed the procedure including the risks, benefits and alternatives for the proposed anesthesia with the patient or authorized representative who has indicated his/her understanding and acceptance.   Dental advisory given  Plan Discussed with: CRNA and Anesthesiologist  Anesthesia Plan Comments: (L. Open radius and ulna fractures T12 compression fracture, stable per Dr. Bevely Palmeritty (neurosurgery)  Hypertension  Plan GA with oral ETT and supraclavicular block  Kipp Broodavid Dezra Mandella  )        Anesthesia Quick Evaluation

## 2016-06-15 NOTE — ED Notes (Signed)
Re-paged Dr. Amanda PeaGramig to [phone]

## 2016-06-15 NOTE — Consult Note (Signed)
Reason for Consult:open fracture left distal radius and ulnar shaft Referring Physician: Braylon Lemmons is an 50 y.o. female.  HPI: The patient is a very pleasant 50 year old female who unfortunately sustained a fall from a 1 story roof earlier today. She and her husband were repairing the roof when the fall occurred. She denies loss of consciousness. She complains of severe left upper extremity pain as well as low back pain. She is previously been seen and evaluated by the emergency room staff she is noted have an open fractures of the ulna. She addition sustained a displaced distal radius fracture. He complains of mild tingling about the ulnar nerve distribution no frank numbness. He denies cervical neck pain. She denies lower extremity pain. She denies right upper extremity pain. He T scan of the abdomen and chest revealed a T2 compression fracture about the superior endplate. There was also noted to be a small 5 mm nodule about the right lung middle lobe. Patient denies tobacco use. Past medical history is reviewed at length.  Past Medical History  Diagnosis Date  . Hypertension     Past Surgical History  Procedure Laterality Date  . Tubal ligation    . Tubal ligation  1991    History reviewed. No pertinent family history.  Social History:  reports that she has never smoked. She does not have any smokeless tobacco history on file. She reports that she does not drink alcohol or use illicit drugs.  Allergies: No Known Allergies  Medications: Tylenol PM as needed, daily headache powders as needed, Xanax 1 mg as needed for anxiety  Results for orders placed or performed during the hospital encounter of 06/15/16 (from the past 48 hour(s))  Comprehensive metabolic panel     Status: Abnormal   Collection Time: 06/15/16  8:55 AM  Result Value Ref Range   Sodium 139 135 - 145 mmol/L   Potassium 4.3 3.5 - 5.1 mmol/L   Chloride 106 101 - 111 mmol/L   CO2 25 22 - 32 mmol/L    Glucose, Bld 122 (H) 65 - 99 mg/dL   BUN 20 6 - 20 mg/dL   Creatinine, Ser 0.87 0.44 - 1.00 mg/dL   Calcium 9.5 8.9 - 10.3 mg/dL   Total Protein 6.4 (L) 6.5 - 8.1 g/dL   Albumin 3.6 3.5 - 5.0 g/dL   AST 27 15 - 41 U/L   ALT 18 14 - 54 U/L   Alkaline Phosphatase 48 38 - 126 U/L   Total Bilirubin 0.3 0.3 - 1.2 mg/dL   GFR calc non Af Amer >60 >60 mL/min   GFR calc Af Amer >60 >60 mL/min    Comment: (NOTE) The eGFR has been calculated using the CKD EPI equation. This calculation has not been validated in all clinical situations. eGFR's persistently <60 mL/min signify possible Chronic Kidney Disease.    Anion gap 8 5 - 15  CBC     Status: Abnormal   Collection Time: 06/15/16  8:55 AM  Result Value Ref Range   WBC 9.9 4.0 - 10.5 K/uL   RBC 4.08 3.87 - 5.11 MIL/uL   Hemoglobin 12.0 12.0 - 15.0 g/dL   HCT 35.7 (L) 36.0 - 46.0 %   MCV 87.5 78.0 - 100.0 fL   MCH 29.4 26.0 - 34.0 pg   MCHC 33.6 30.0 - 36.0 g/dL   RDW 12.6 11.5 - 15.5 %   Platelets 345 150 - 400 K/uL  Protime-INR     Status: None  Collection Time: 06/15/16  8:55 AM  Result Value Ref Range   Prothrombin Time 13.5 11.6 - 15.2 seconds   INR 1.01 0.00 - 1.49  Sample to Blood Bank     Status: None   Collection Time: 06/15/16  8:55 AM  Result Value Ref Range   Blood Bank Specimen SAMPLE AVAILABLE FOR TESTING    Sample Expiration 06/16/2016   I-Stat Beta hCG blood, ED ([facility], [facility], [facility] only)     Status: None   Collection Time: 06/15/16  9:16 AM  Result Value Ref Range   I-stat hCG, quantitative <5.0 <5 mIU/mL   Comment 3            Comment:   GEST. AGE      CONC.  (mIU/mL)   <=1 WEEK        5 - 50     2 WEEKS       50 - 500     3 WEEKS       100 - 10,000     4 WEEKS     1,000 - 30,000        FEMALE AND NON-PREGNANT FEMALE:     LESS THAN 5 mIU/mL   I-Stat Chem 8, ED     Status: Abnormal   Collection Time: 06/15/16  9:18 AM  Result Value Ref Range   Sodium 140 135 - 145 mmol/L   Potassium 4.2 3.5 - 5.1 mmol/L    Chloride 104 101 - 111 mmol/L   BUN 25 (H) 6 - 20 mg/dL   Creatinine, Ser 0.90 0.44 - 1.00 mg/dL   Glucose, Bld 123 (H) 65 - 99 mg/dL   Calcium, Ion 1.19 1.13 - 1.30 mmol/L   TCO2 27 0 - 100 mmol/L   Hemoglobin 12.2 12.0 - 15.0 g/dL   HCT 36.0 36.0 - 46.0 %  I-Stat CG4 Lactic Acid, ED     Status: None   Collection Time: 06/15/16  9:19 AM  Result Value Ref Range   Lactic Acid, Venous 1.62 0.5 - 1.9 mmol/L    Dg Forearm Left  06/15/2016  CLINICAL DATA:  Fall from roof this morning with obvious pain and deformity left wrist. EXAM: LEFT FOREARM - 2 VIEW COMPARISON:  None. FINDINGS: There is a significantly displaced transverse fracture of the distal radial metaphysis with 1 shaft's with of radial displacement and 2 shaft's with dorsal displacement of the distal fragment. Mild dorsal angulation of the distal fragment. Displaced transverse fracture of the distal ulnar diaphysis with 1 shaft's with of radial and dorsal displacement of the distal fragment as well as dorsal angulation of the distal fragment. Displaced ulnar styloid fracture. IMPRESSION: Significantly displaced distal radial metaphyseal fracture and distal ulnar diaphyseal fractures as described. Ulnar styloid fracture. Electronically Signed   By: Marin Olp M.D.   On: 06/15/2016 09:54   Dg Wrist 2 Views Left  06/15/2016  CLINICAL DATA:  Fall from roof this morning with pain and deformity left wrist. EXAM: LEFT WRIST - 2 VIEW COMPARISON:  03/24/2011 FINDINGS: There is transverse fracture of the distal radial metaphysis with significant displacement. One shaft's with of radial displacement with 2 shaft's with of posterior displacement of the distal fragment. There is a displaced transverse fracture of the distal diaphysis of the ulna with approximately 1 shaft's with of radial and dorsal displacement of the distal fragment. There is dorsal angulation both the distal radial and ulnar fragments. Displaced ulnar styloid fracture. IMPRESSION:  Significantly displaced distal radial  metaphyseal fracture as well as displaced distal ulnar diaphyseal fracture as described. Displaced ulnar styloid fracture. Electronically Signed   By: Marin Olp M.D.   On: 06/15/2016 09:52   Ct Head Wo Contrast  06/15/2016  CLINICAL DATA:  Fall from roof this morning. EXAM: CT HEAD WITHOUT CONTRAST CT CERVICAL SPINE WITHOUT CONTRAST TECHNIQUE: Multidetector CT imaging of the head and cervical spine was performed following the standard protocol without intravenous contrast. Multiplanar CT image reconstructions of the cervical spine were also generated. COMPARISON:  None. FINDINGS: CT HEAD FINDINGS Ventricles, cisterns and other CSF spaces are within normal. There is no mass, mass effect, shift of midline structures or acute hemorrhage. No evidence of acute infarction. Subtle opacification over the ethmoid sinus. Possible subtle fracture of the nasal bone which may be acute or chronic. Deviation of the nasal septum to the left. CT CERVICAL SPINE FINDINGS Vertebral body alignment, heights and disc space heights are within normal. Very minimal spondylosis of the mid to lower cervical spine. Prevertebral soft tissues are normal. The atlantoaxial articulation is normal. Minimal uncovertebral joint spurring. No acute fracture or subluxation. Remainder of the exam is within normal. IMPRESSION: No acute intracranial findings. Possible subtle fracture of the nasal bone which may be acute or chronic. Opacification in the ethmoid sinus. No acute cervical spine injury. Subtle spondylosis of the mid to lower cervical spine. Electronically Signed   By: Marin Olp M.D.   On: 06/15/2016 10:44   Ct Chest W Contrast  06/15/2016  CLINICAL DATA:  Fall from roof.  Initial encounter. EXAM: CT CHEST, ABDOMEN AND PELVIS WITHOUT CONTRAST TECHNIQUE: Multidetector CT imaging of the chest, abdomen and pelvis was performed following the standard protocol without IV contrast. COMPARISON:  None.  FINDINGS: CT CHEST FINDINGS Mediastinum/Lymph Nodes: There is no axillary lymphadenopathy. No mediastinal lymphadenopathy. There is no hilar lymphadenopathy. No evidence for mediastinal hemorrhage. Although not dedicated CTA exam, no irregular wall thickening in the thoracic aorta. No evidence for dissection of the thoracic aorta. The heart size is normal. No pericardial effusion. The esophagus has normal imaging features. Lungs/Pleura: No evidence for pneumothorax. No focal lung contusion. No evidence for posttraumatic spermatocele. No pleural effusion. 5 mm right middle lobe pulmonary nodule is seen on image 63 series 3. 4 mm triangular-shaped perifissural nodule seen on image 61 series 3 is probably a subpleural lymph node. Musculoskeletal: No evidence for rib fracture. T12 superior endplate compression fracture has acute appearance by CT. Fracture results in less than 25% loss of height anteriorly and posteriorly with central endplate depression. 3-4 mm posterior bony retropulsion of posterior cortex identified in the upper T12 vertebral body. CT ABDOMEN PELVIS FINDINGS Hepatobiliary: No focal abnormality within the liver parenchyma. There is no evidence for gallstones, gallbladder wall thickening, or pericholecystic fluid. Common bile duct measures 7-8 mm diameter in the pancreatic head. Pancreas: No focal mass lesion. No dilatation of the main duct. No intraparenchymal cyst. No peripancreatic edema. Spleen: No splenomegaly. No focal mass lesion. Adrenals/Urinary Tract: No adrenal nodule or mass. Kidneys show symmetric perfusion and excretion. No perinephric edema or fluid collection. No evidence for hydroureter. The urinary bladder appears normal for the degree of distention. Stomach/Bowel: Stomach is nondistended. No gastric wall thickening. No evidence of outlet obstruction. Duodenum is normally positioned as is the ligament of Treitz. No small bowel wall thickening. No small bowel dilatation. The terminal  ileum is normal. No gross colonic mass. No colonic wall thickening. No substantial diverticular change. Vascular/Lymphatic: No abdominal aortic aneurysm.  No abdominal aortic atherosclerotic calcification. There is no gastrohepatic or hepatoduodenal ligament lymphadenopathy. No intraperitoneal or retroperitoneal lymphadenopathy. No pelvic sidewall lymphadenopathy. Reproductive: Endometrial stripe is somewhat prominent which may be within normal limits for age. There is no adnexal mass. Other: No intraperitoneal free fluid. Musculoskeletal: T12 compression fracture is mentioned above. Otherwise no lumbar spine or bony pelvic fracture. IMPRESSION: 1. T12 compression fracture as described. 2. No evidence for non bony acute traumatic organ injury in the chest, abdomen, or pelvis. No intraperitoneal free fluid. 3. Mild dilatation of the common bile duct without identifiable a etiology. No associated dilatation of the main pancreatic duct. Correlation with liver function test may prove helpful. 4. 5 mm right middle lobe pulmonary nodule No follow-up needed if patient is low-risk. Non-contrast chest CT can be considered in 12 months if patient is high-risk. This recommendation follows the consensus statement: Guidelines for Management of Incidental Pulmonary Nodules Detected on CT Images:From the Fleischner Society 2017; published online before print (10.1148/[id number]). Electronically Signed   By: Misty Stanley M.D.   On: 06/15/2016 11:08   Ct Cervical Spine Wo Contrast  06/15/2016  CLINICAL DATA:  Fall from roof this morning. EXAM: CT HEAD WITHOUT CONTRAST CT CERVICAL SPINE WITHOUT CONTRAST TECHNIQUE: Multidetector CT imaging of the head and cervical spine was performed following the standard protocol without intravenous contrast. Multiplanar CT image reconstructions of the cervical spine were also generated. COMPARISON:  None. FINDINGS: CT HEAD FINDINGS Ventricles, cisterns and other CSF spaces are within  normal. There is no mass, mass effect, shift of midline structures or acute hemorrhage. No evidence of acute infarction. Subtle opacification over the ethmoid sinus. Possible subtle fracture of the nasal bone which may be acute or chronic. Deviation of the nasal septum to the left. CT CERVICAL SPINE FINDINGS Vertebral body alignment, heights and disc space heights are within normal. Very minimal spondylosis of the mid to lower cervical spine. Prevertebral soft tissues are normal. The atlantoaxial articulation is normal. Minimal uncovertebral joint spurring. No acute fracture or subluxation. Remainder of the exam is within normal. IMPRESSION: No acute intracranial findings. Possible subtle fracture of the nasal bone which may be acute or chronic. Opacification in the ethmoid sinus. No acute cervical spine injury. Subtle spondylosis of the mid to lower cervical spine. Electronically Signed   By: Marin Olp M.D.   On: 06/15/2016 10:44   Ct Abdomen Pelvis W Contrast  06/15/2016  CLINICAL DATA:  Fall from roof.  Initial encounter. EXAM: CT CHEST, ABDOMEN AND PELVIS WITHOUT CONTRAST TECHNIQUE: Multidetector CT imaging of the chest, abdomen and pelvis was performed following the standard protocol without IV contrast. COMPARISON:  None. FINDINGS: CT CHEST FINDINGS Mediastinum/Lymph Nodes: There is no axillary lymphadenopathy. No mediastinal lymphadenopathy. There is no hilar lymphadenopathy. No evidence for mediastinal hemorrhage. Although not dedicated CTA exam, no irregular wall thickening in the thoracic aorta. No evidence for dissection of the thoracic aorta. The heart size is normal. No pericardial effusion. The esophagus has normal imaging features. Lungs/Pleura: No evidence for pneumothorax. No focal lung contusion. No evidence for posttraumatic spermatocele. No pleural effusion. 5 mm right middle lobe pulmonary nodule is seen on image 63 series 3. 4 mm triangular-shaped perifissural nodule seen on image 61  series 3 is probably a subpleural lymph node. Musculoskeletal: No evidence for rib fracture. T12 superior endplate compression fracture has acute appearance by CT. Fracture results in less than 25% loss of height anteriorly and posteriorly with central endplate depression. 3-4 mm posterior bony retropulsion  of posterior cortex identified in the upper T12 vertebral body. CT ABDOMEN PELVIS FINDINGS Hepatobiliary: No focal abnormality within the liver parenchyma. There is no evidence for gallstones, gallbladder wall thickening, or pericholecystic fluid. Common bile duct measures 7-8 mm diameter in the pancreatic head. Pancreas: No focal mass lesion. No dilatation of the main duct. No intraparenchymal cyst. No peripancreatic edema. Spleen: No splenomegaly. No focal mass lesion. Adrenals/Urinary Tract: No adrenal nodule or mass. Kidneys show symmetric perfusion and excretion. No perinephric edema or fluid collection. No evidence for hydroureter. The urinary bladder appears normal for the degree of distention. Stomach/Bowel: Stomach is nondistended. No gastric wall thickening. No evidence of outlet obstruction. Duodenum is normally positioned as is the ligament of Treitz. No small bowel wall thickening. No small bowel dilatation. The terminal ileum is normal. No gross colonic mass. No colonic wall thickening. No substantial diverticular change. Vascular/Lymphatic: No abdominal aortic aneurysm. No abdominal aortic atherosclerotic calcification. There is no gastrohepatic or hepatoduodenal ligament lymphadenopathy. No intraperitoneal or retroperitoneal lymphadenopathy. No pelvic sidewall lymphadenopathy. Reproductive: Endometrial stripe is somewhat prominent which may be within normal limits for age. There is no adnexal mass. Other: No intraperitoneal free fluid. Musculoskeletal: T12 compression fracture is mentioned above. Otherwise no lumbar spine or bony pelvic fracture. IMPRESSION: 1. T12 compression fracture as  described. 2. No evidence for non bony acute traumatic organ injury in the chest, abdomen, or pelvis. No intraperitoneal free fluid. 3. Mild dilatation of the common bile duct without identifiable a etiology. No associated dilatation of the main pancreatic duct. Correlation with liver function test may prove helpful. 4. 5 mm right middle lobe pulmonary nodule No follow-up needed if patient is low-risk. Non-contrast chest CT can be considered in 12 months if patient is high-risk. This recommendation follows the consensus statement: Guidelines for Management of Incidental Pulmonary Nodules Detected on CT Images:From the Fleischner Society 2017; published online before print (10.1148/[id number]). Electronically Signed   By: Kennith Center M.D.   On: 06/15/2016 11:08    Review of Systems  Constitutional: Negative.   HENT: Negative.   Eyes: Negative.   Respiratory: Negative.   Cardiovascular: Negative.   Gastrointestinal: Negative.   Musculoskeletal:       See history of present illness  Skin: Negative.   Neurological: Negative.   Endo/Heme/Allergies: Negative.   Psychiatric/Behavioral:       History of anxiety.   Blood pressure 106/69, pulse 80, resp. rate 10, last menstrual period 06/15/2016, SpO2 95 %. Physical Exam  The patient is alert and oriented in no acute distress. The patient complains of pain in the affected upper extremity.  The patient is noted to have a normal HEENT exam. Lung fields show equal chest expansion and no shortness of breath. Abdomen exam is nontender without distention. Lower extremity examination does not show any fracture dislocation or blood clot symptoms. Pelvis is stable and the neck and back are stable and nontender. Evaluation of the left upper extremity shows obvious deformity about the left ulnar shaft. She has open lacerations of the skin about the volar ulnar aspect of the distal wrist region as well as ulnarly proximal to the styloid region.  Subcutaneous tissue is exposed about the open regions. The median nerve distribution is intact she has mild stages along the ulnar nerve distribution of ring and small finger. Her refill is intact. Elbow is nontender shoulder is nontender. Assessment/Plan: Status post fall with Open fracture left ulna as well as left distal radius fracture and T12 compression fracture History  of hypertension History of anxiety We have discussed with her the nature of her upper extremity predicament and the need for surgical intervention. We discussed with her given the fact this is an open fracture the high propensity for infection. Ancef IV has been started for antibiotic administration. In addition after obtaining verbal consent I have cleansed the upper extremity and have irrigated the wounds with greater than 2 L of normal saline. Following this dressings and a long-arm splint was applied to the left upper extremity.We are planning surgery for your upper extremity. The risk and benefits of surgery to include risk of bleeding, infection, anesthesia,  damage to normal structures and failure of the surgery to accomplish its intended goals of relieving symptoms and restoring function have been discussed in detail. With this in mind we plan to proceed. I have specifically discussed with the patient the pre-and postoperative regime and the dos and don'ts and risk and benefits in great detail. Risk and benefits of surgery also include risk of dystrophy(CRPS), chronic nerve pain, failure of the healing process to go onto completion and other inherent risks of surgery The relavent the pathophysiology of the disease/injury process, as well as the alternatives for treatment and postoperative course of action has been discussed in great detail with the patient who desires to proceed.  We will do everything in our power to help you (the patient) restore function to the upper extremity. It is a pleasure to see this patient  today.   Eulah Walkup L 06/15/2016, 12:46 PM

## 2016-06-15 NOTE — ED Provider Notes (Addendum)
CSN: [id number]     Arrival date & time 06/15/16  1324 History   First MD Initiated Contact with Patient 06/15/16 0848     Chief Complaint  Patient presents with  . Fall  . Arm Injury    (Consider location/radiation/quality/duration/timing/severity/associated sxs/prior Treatment) Patient is a 50 y.o. female presenting with fall. The history is provided by the patient and the spouse.  Fall This is a new problem. The current episode started today. The problem has been unchanged. Associated symptoms include neck pain. Pertinent negatives include no abdominal pain or chest pain. Associated symptoms comments: Left arm pain.  Left head and neck pain. Exacerbated by: movement. She has tried nothing for the symptoms. The treatment provided no relief.    Past Medical History  Diagnosis Date  . Hypertension    Past Surgical History  Procedure Laterality Date  . Tubal ligation    . Tubal ligation  1991   History reviewed. No pertinent family history. Social History  Substance Use Topics  . Smoking status: Never Smoker   . Smokeless tobacco: None  . Alcohol Use: No   OB History    Gravida Para Term Preterm AB TAB SAB Ectopic Multiple Living   2 2 2       1      Review of Systems  Unable to perform ROS: Acuity of condition  Cardiovascular: Negative for chest pain and palpitations.  Gastrointestinal: Negative for abdominal pain.  Genitourinary: Negative for dysuria and flank pain.  Musculoskeletal: Positive for back pain and neck pain.       Left wrist pain  Skin: Positive for wound (left wrist).  Allergic/Immunologic: Negative.   Neurological: Negative for dizziness and light-headedness.  Psychiatric/Behavioral: Negative for confusion.      Allergies  Review of patient's allergies indicates no known allergies.  Home Medications   Prior to Admission medications   Medication Sig Start Date End Date Taking? Authorizing Provider  ALPRAZolam Prudy Feeler) 1 MG tablet Take 1 mg by  mouth 3 (three) times daily as needed for anxiety.   Yes Historical Provider, MD  Aspirin-Acetaminophen-Caffeine (GOODY HEADACHE PO) Take 1 packet by mouth daily as needed (pain).   Yes Historical Provider, MD  diphenhydramine-acetaminophen (TYLENOL PM) 25-500 MG TABS Take 2 tablets by mouth at bedtime as needed (sleep and pain.).   Yes Historical Provider, MD   BP 126/76 mmHg  Pulse 97  Resp 18  SpO2 93%  LMP 06/15/2016 Physical Exam  Constitutional: She is oriented to person, place, and time. She appears well-developed and well-nourished. She appears distressed.  HENT:  Head: Normocephalic.  Bleeding from left ear, unable to visualize TM  No hyphema, nasal septal hematoma, battles sign, racoon eyes, or trismus.     Eyes: Conjunctivae and EOM are normal. Pupils are equal, round, and reactive to light.  Neck: Normal range of motion. Neck supple.  Cardiovascular: Normal rate, regular rhythm and normal heart sounds.   Pulses:      Radial pulses are 2+ on the right side, and 2+ on the left side.  Pulmonary/Chest: Effort normal and breath sounds normal. No respiratory distress.  Abdominal: Soft. Bowel sounds are normal. There is no tenderness. There is no rebound.    Musculoskeletal: Normal range of motion.       Back:       Arms:      Left hand: Decreased sensation is not present in the ulnar distribution, is not present in the medial redistribution and is not present in  the radial distribution. She exhibits no finger abduction, no thumb/finger opposition and no wrist extension trouble.  Neurological: She is alert and oriented to person, place, and time. She has normal reflexes. No cranial nerve deficit.  Skin: Skin is warm and dry. She is not diaphoretic.    ED Course  Procedures (including critical care time) Labs Review Labs Reviewed  COMPREHENSIVE METABOLIC PANEL - Abnormal; Notable for the following:    Glucose, Bld 122 (*)    Total Protein 6.4 (*)    All other components  within normal limits  CBC - Abnormal; Notable for the following:    HCT 35.7 (*)    All other components within normal limits  URINALYSIS, ROUTINE W REFLEX MICROSCOPIC (NOT AT [facility]) - Abnormal; Notable for the following:    Specific Gravity, Urine <1.005 (*)    Hgb urine dipstick LARGE (*)    All other components within normal limits  URINE MICROSCOPIC-ADD ON - Abnormal; Notable for the following:    Bacteria, UA FEW (*)    All other components within normal limits  I-STAT CHEM 8, ED - Abnormal; Notable for the following:    BUN 25 (*)    Glucose, Bld 123 (*)    All other components within normal limits  PROTIME-INR  ETHANOL  I-STAT CG4 LACTIC ACID, ED  I-STAT BETA HCG BLOOD, ED ([facility], [facility], [facility] ONLY)  SAMPLE TO BLOOD BANK    Imaging Review Dg Forearm Left  06/15/2016  CLINICAL DATA:  Fall from roof this morning with obvious pain and deformity left wrist. EXAM: LEFT FOREARM - 2 VIEW COMPARISON:  None. FINDINGS: There is a significantly displaced transverse fracture of the distal radial metaphysis with 1 shaft's with of radial displacement and 2 shaft's with dorsal displacement of the distal fragment. Mild dorsal angulation of the distal fragment. Displaced transverse fracture of the distal ulnar diaphysis with 1 shaft's with of radial and dorsal displacement of the distal fragment as well as dorsal angulation of the distal fragment. Displaced ulnar styloid fracture. IMPRESSION: Significantly displaced distal radial metaphyseal fracture and distal ulnar diaphyseal fractures as described. Ulnar styloid fracture. Electronically Signed   By: Elberta Fortis M.D.   On: 06/15/2016 09:54   Dg Wrist 2 Views Left  06/15/2016  CLINICAL DATA:  Fall from roof this morning with pain and deformity left wrist. EXAM: LEFT WRIST - 2 VIEW COMPARISON:  03/24/2011 FINDINGS: There is transverse fracture of the distal radial metaphysis with significant displacement. One shaft's with of radial displacement with 2  shaft's with of posterior displacement of the distal fragment. There is a displaced transverse fracture of the distal diaphysis of the ulna with approximately 1 shaft's with of radial and dorsal displacement of the distal fragment. There is dorsal angulation both the distal radial and ulnar fragments. Displaced ulnar styloid fracture. IMPRESSION: Significantly displaced distal radial metaphyseal fracture as well as displaced distal ulnar diaphyseal fracture as described. Displaced ulnar styloid fracture. Electronically Signed   By: Elberta Fortis M.D.   On: 06/15/2016 09:52   Ct Head Wo Contrast  06/15/2016  CLINICAL DATA:  Fall from roof this morning. EXAM: CT HEAD WITHOUT CONTRAST CT CERVICAL SPINE WITHOUT CONTRAST TECHNIQUE: Multidetector CT imaging of the head and cervical spine was performed following the standard protocol without intravenous contrast. Multiplanar CT image reconstructions of the cervical spine were also generated. COMPARISON:  None. FINDINGS: CT HEAD FINDINGS Ventricles, cisterns and other CSF spaces are within normal. There is no mass, mass effect,  shift of midline structures or acute hemorrhage. No evidence of acute infarction. Subtle opacification over the ethmoid sinus. Possible subtle fracture of the nasal bone which may be acute or chronic. Deviation of the nasal septum to the left. CT CERVICAL SPINE FINDINGS Vertebral body alignment, heights and disc space heights are within normal. Very minimal spondylosis of the mid to lower cervical spine. Prevertebral soft tissues are normal. The atlantoaxial articulation is normal. Minimal uncovertebral joint spurring. No acute fracture or subluxation. Remainder of the exam is within normal. IMPRESSION: No acute intracranial findings. Possible subtle fracture of the nasal bone which may be acute or chronic. Opacification in the ethmoid sinus. No acute cervical spine injury. Subtle spondylosis of the mid to lower cervical spine. Electronically  Signed   By: Elberta Fortis M.D.   On: 06/15/2016 10:44   Ct Chest W Contrast  06/15/2016  CLINICAL DATA:  Fall from roof.  Initial encounter. EXAM: CT CHEST, ABDOMEN AND PELVIS WITHOUT CONTRAST TECHNIQUE: Multidetector CT imaging of the chest, abdomen and pelvis was performed following the standard protocol without IV contrast. COMPARISON:  None. FINDINGS: CT CHEST FINDINGS Mediastinum/Lymph Nodes: There is no axillary lymphadenopathy. No mediastinal lymphadenopathy. There is no hilar lymphadenopathy. No evidence for mediastinal hemorrhage. Although not dedicated CTA exam, no irregular wall thickening in the thoracic aorta. No evidence for dissection of the thoracic aorta. The heart size is normal. No pericardial effusion. The esophagus has normal imaging features. Lungs/Pleura: No evidence for pneumothorax. No focal lung contusion. No evidence for posttraumatic spermatocele. No pleural effusion. 5 mm right middle lobe pulmonary nodule is seen on image 63 series 3. 4 mm triangular-shaped perifissural nodule seen on image 61 series 3 is probably a subpleural lymph node. Musculoskeletal: No evidence for rib fracture. T12 superior endplate compression fracture has acute appearance by CT. Fracture results in less than 25% loss of height anteriorly and posteriorly with central endplate depression. 3-4 mm posterior bony retropulsion of posterior cortex identified in the upper T12 vertebral body. CT ABDOMEN PELVIS FINDINGS Hepatobiliary: No focal abnormality within the liver parenchyma. There is no evidence for gallstones, gallbladder wall thickening, or pericholecystic fluid. Common bile duct measures 7-8 mm diameter in the pancreatic head. Pancreas: No focal mass lesion. No dilatation of the main duct. No intraparenchymal cyst. No peripancreatic edema. Spleen: No splenomegaly. No focal mass lesion. Adrenals/Urinary Tract: No adrenal nodule or mass. Kidneys show symmetric perfusion and excretion. No perinephric edema  or fluid collection. No evidence for hydroureter. The urinary bladder appears normal for the degree of distention. Stomach/Bowel: Stomach is nondistended. No gastric wall thickening. No evidence of outlet obstruction. Duodenum is normally positioned as is the ligament of Treitz. No small bowel wall thickening. No small bowel dilatation. The terminal ileum is normal. No gross colonic mass. No colonic wall thickening. No substantial diverticular change. Vascular/Lymphatic: No abdominal aortic aneurysm. No abdominal aortic atherosclerotic calcification. There is no gastrohepatic or hepatoduodenal ligament lymphadenopathy. No intraperitoneal or retroperitoneal lymphadenopathy. No pelvic sidewall lymphadenopathy. Reproductive: Endometrial stripe is somewhat prominent which may be within normal limits for age. There is no adnexal mass. Other: No intraperitoneal free fluid. Musculoskeletal: T12 compression fracture is mentioned above. Otherwise no lumbar spine or bony pelvic fracture. IMPRESSION: 1. T12 compression fracture as described. 2. No evidence for non bony acute traumatic organ injury in the chest, abdomen, or pelvis. No intraperitoneal free fluid. 3. Mild dilatation of the common bile duct without identifiable a etiology. No associated dilatation of the main pancreatic duct. Correlation  with liver function test may prove helpful. 4. 5 mm right middle lobe pulmonary nodule No follow-up needed if patient is low-risk. Non-contrast chest CT can be considered in 12 months if patient is high-risk. This recommendation follows the consensus statement: Guidelines for Management of Incidental Pulmonary Nodules Detected on CT Images:From the Fleischner Society 2017; published online before print (10.1148/radiol.[id number]). Electronically Signed   By: Kennith Center M.D.   On: 06/15/2016 11:08   Ct Cervical Spine Wo Contrast  06/15/2016  CLINICAL DATA:  Fall from roof this morning. EXAM: CT HEAD WITHOUT CONTRAST CT  CERVICAL SPINE WITHOUT CONTRAST TECHNIQUE: Multidetector CT imaging of the head and cervical spine was performed following the standard protocol without intravenous contrast. Multiplanar CT image reconstructions of the cervical spine were also generated. COMPARISON:  None. FINDINGS: CT HEAD FINDINGS Ventricles, cisterns and other CSF spaces are within normal. There is no mass, mass effect, shift of midline structures or acute hemorrhage. No evidence of acute infarction. Subtle opacification over the ethmoid sinus. Possible subtle fracture of the nasal bone which may be acute or chronic. Deviation of the nasal septum to the left. CT CERVICAL SPINE FINDINGS Vertebral body alignment, heights and disc space heights are within normal. Very minimal spondylosis of the mid to lower cervical spine. Prevertebral soft tissues are normal. The atlantoaxial articulation is normal. Minimal uncovertebral joint spurring. No acute fracture or subluxation. Remainder of the exam is within normal. IMPRESSION: No acute intracranial findings. Possible subtle fracture of the nasal bone which may be acute or chronic. Opacification in the ethmoid sinus. No acute cervical spine injury. Subtle spondylosis of the mid to lower cervical spine. Electronically Signed   By: Elberta Fortis M.D.   On: 06/15/2016 10:44   Ct Abdomen Pelvis W Contrast  06/15/2016  CLINICAL DATA:  Fall from roof.  Initial encounter. EXAM: CT CHEST, ABDOMEN AND PELVIS WITHOUT CONTRAST TECHNIQUE: Multidetector CT imaging of the chest, abdomen and pelvis was performed following the standard protocol without IV contrast. COMPARISON:  None. FINDINGS: CT CHEST FINDINGS Mediastinum/Lymph Nodes: There is no axillary lymphadenopathy. No mediastinal lymphadenopathy. There is no hilar lymphadenopathy. No evidence for mediastinal hemorrhage. Although not dedicated CTA exam, no irregular wall thickening in the thoracic aorta. No evidence for dissection of the thoracic aorta. The  heart size is normal. No pericardial effusion. The esophagus has normal imaging features. Lungs/Pleura: No evidence for pneumothorax. No focal lung contusion. No evidence for posttraumatic spermatocele. No pleural effusion. 5 mm right middle lobe pulmonary nodule is seen on image 63 series 3. 4 mm triangular-shaped perifissural nodule seen on image 61 series 3 is probably a subpleural lymph node. Musculoskeletal: No evidence for rib fracture. T12 superior endplate compression fracture has acute appearance by CT. Fracture results in less than 25% loss of height anteriorly and posteriorly with central endplate depression. 3-4 mm posterior bony retropulsion of posterior cortex identified in the upper T12 vertebral body. CT ABDOMEN PELVIS FINDINGS Hepatobiliary: No focal abnormality within the liver parenchyma. There is no evidence for gallstones, gallbladder wall thickening, or pericholecystic fluid. Common bile duct measures 7-8 mm diameter in the pancreatic head. Pancreas: No focal mass lesion. No dilatation of the main duct. No intraparenchymal cyst. No peripancreatic edema. Spleen: No splenomegaly. No focal mass lesion. Adrenals/Urinary Tract: No adrenal nodule or mass. Kidneys show symmetric perfusion and excretion. No perinephric edema or fluid collection. No evidence for hydroureter. The urinary bladder appears normal for the degree of distention. Stomach/Bowel: Stomach is nondistended. No gastric wall  thickening. No evidence of outlet obstruction. Duodenum is normally positioned as is the ligament of Treitz. No small bowel wall thickening. No small bowel dilatation. The terminal ileum is normal. No gross colonic mass. No colonic wall thickening. No substantial diverticular change. Vascular/Lymphatic: No abdominal aortic aneurysm. No abdominal aortic atherosclerotic calcification. There is no gastrohepatic or hepatoduodenal ligament lymphadenopathy. No intraperitoneal or retroperitoneal lymphadenopathy. No  pelvic sidewall lymphadenopathy. Reproductive: Endometrial stripe is somewhat prominent which may be within normal limits for age. There is no adnexal mass. Other: No intraperitoneal free fluid. Musculoskeletal: T12 compression fracture is mentioned above. Otherwise no lumbar spine or bony pelvic fracture. IMPRESSION: 1. T12 compression fracture as described. 2. No evidence for non bony acute traumatic organ injury in the chest, abdomen, or pelvis. No intraperitoneal free fluid. 3. Mild dilatation of the common bile duct without identifiable a etiology. No associated dilatation of the main pancreatic duct. Correlation with liver function test may prove helpful. 4. 5 mm right middle lobe pulmonary nodule No follow-up needed if patient is low-risk. Non-contrast chest CT can be considered in 12 months if patient is high-risk. This recommendation follows the consensus statement: Guidelines for Management of Incidental Pulmonary Nodules Detected on CT Images:From the Fleischner Society 2017; published online before print (10.1148/[id number]). Electronically Signed   By: Kennith CenterEric  Mansell M.D.   On: 06/15/2016 11:08   I have personally reviewed and evaluated these images and lab results as part of my medical decision-making.   EKG Interpretation None      MDM   Final diagnoses:  Fall  Arm fracture, left, open, initial encounter  T12 compression fracture, initial encounter [facility](HCC)   The patient is a 50 year old female who suffered a fall from a roof roughly 12 feet earlier in the day. She reports landing on her left side with immediate pain on her left arm and striking the left side of her entire body in her head. Reports open fracture to the left wrist with a bleeding from the left ear as well.  On evaluation ABCs were intact with left upper extremity neurovascularly intact. Difficult to ascertain if bleeding from external auditory canal or from TM due to bloody canal. Neurologically  intact.  Fentanyl, Dilaudid, IV fluids and Ancef given upon arrival. X-rays of the left wrist displayed comminuted distal radius and ulnar fracture. Hand surgery consulted.  Found to have T12 compression but neurologically intact distally.  [facility] recommend TLSO brace with upright films following.   Hand surgery agrees to take pt to the OR for ORIF of L radius/ulna fx.    Labs and imaging were viewed by myself and incorporated into medical decision making.  Discussed pertinent finding with patient or caregiver prior to admission with no further questions.  Pt care supervised by my attending Dr. Jodi MourningZavitz.   Tery SanfilippoMatthew Riester, MD PGY-3 Emergency Medicine     Tery SanfilippoMatthew Riester, MD 06/15/16 1614  Blane OharaJoshua Santosh Petter, MD 06/19/16 1537  Medical screening examination/treatment/procedure(s) were conducted as a shared visit with non-physician practitioner(s) or resident  and myself.  I personally evaluated the patient during the encounter and agree with the findings.   I have personally reviewed any xrays and/ or EKG's with the provider and I agree with interpretation.   Fall - Plan: DG Wrist 2 Views Left, DG Forearm Left, DG Wrist 2 Views Left, DG Forearm Left  Fall - Plan: DG Wrist 2 Views Left, DG Forearm Left, DG Wrist 2 Views Left, DG Forearm Left   I was present for FAST  EMERGENCY DEPARTMENT [place] FAST EXAM  INDICATIONS:Blunt trauma to the Thorax  PERFORMED BY: Myself  IMAGES ARCHIVED?: Yes  FINDINGS: All views negative  LIMITATIONS:  Body habitus  INTERPRETATION:  No abdominal free fluid  COMMENT:     Blane Ohara, MD 06/20/16 [phone]

## 2016-06-15 NOTE — ED Notes (Signed)
Returned from CT scan.

## 2016-06-15 NOTE — ED Notes (Signed)
Patient transported to CT 

## 2016-06-15 NOTE — ED Notes (Signed)
Maxie BetterB. Buchanan hand PA  at bedside.

## 2016-06-15 NOTE — ED Notes (Signed)
Pt here after falling off a one story roof pt landed hitting her left arm and head , no loc . Pt has open deformed left forearm pulse intact with moderate bleeding also c/o low back pain

## 2016-06-15 NOTE — Anesthesia Procedure Notes (Addendum)
Anesthesia Regional Block:  Supraclavicular block  Pre-Anesthetic Checklist: ,, timeout performed, Correct Patient, Correct Site, Correct Laterality, Correct Procedure, Correct Position, site marked, Risks and benefits discussed,  Surgical consent,  Pre-op evaluation,  At surgeon's request and post-op pain management  Laterality: Left  Prep: chloraprep       Needles:  Injection technique: Single-shot  Needle Type: Echogenic Stimulator Needle     Needle Length: 9cm 9 cm Needle Gauge: 21 and 21 G    Additional Needles:  Procedures: ultrasound guided (picture in chart) Supraclavicular block Narrative:  Start time: 06/15/2016 4:20 PM End time: 06/15/2016 4:25 PM Injection made incrementally with aspirations every 5 mL.  Performed by: Personally   Additional Notes: 30 cc 0.5% Bupivacaine with 1:200 epi injected easily   Procedure Name: Intubation Date/Time: 06/15/2016 5:03 PM Performed by: Bobbie StackANDERSON, Fred Hammes KIRSTEN Pre-anesthesia Checklist: Patient identified, Emergency Drugs available, Suction available and Patient being monitored Patient Re-evaluated:Patient Re-evaluated prior to inductionOxygen Delivery Method: Circle system utilized Preoxygenation: Pre-oxygenation with 100% oxygen Intubation Type: IV induction Ventilation: Mask ventilation without difficulty Laryngoscope Size: Mac and 3 Grade View: Grade I Tube type: Oral Tube size: 7.0 mm Number of attempts: 1 Airway Equipment and Method: Stylet Placement Confirmation: ETT inserted through vocal cords under direct vision,  positive ETCO2 and breath sounds checked- equal and bilateral Secured at: 21 cm Tube secured with: Tape Dental Injury: Teeth and Oropharynx as per pre-operative assessment

## 2016-06-15 NOTE — Op Note (Signed)
See dictation#[id number] Amanda PeaGramig MD

## 2016-06-15 NOTE — Progress Notes (Signed)
Pharmacy Antibiotic Note  Angela Vaughan is a 50 y.o. female admitted on 06/15/2016 with open fracture of left distal radius/ulna.  Pharmacy has been consulted for Gentamicin dosing for post-op orthopedic hand surgery. WBC WNL. Renal function ok. Pt did receive Gent 120 mg IV x 1 pre-op around 1700.   Plan: -Gentamicin 500 mg IV q24h  -10 hr random gent level  Temp (24hrs), Avg:97.7 F (36.5 C), Min:97.7 F (36.5 C), Max:97.7 F (36.5 C)   Recent Labs Lab 06/15/16 0855 06/15/16 0918 06/15/16 0919  WBC 9.9  --   --   CREATININE 0.87 0.90  --   LATICACIDVEN  --   --  1.62    CrCl cannot be calculated (Unknown ideal weight.).    No Known Allergies    Abran DukeLedford, Chaneka Trefz 06/15/2016 11:42 PM

## 2016-06-16 ENCOUNTER — Inpatient Hospital Stay (HOSPITAL_COMMUNITY): Payer: Self-pay

## 2016-06-16 ENCOUNTER — Encounter (HOSPITAL_COMMUNITY): Payer: Self-pay | Admitting: Orthopedic Surgery

## 2016-06-16 DIAGNOSIS — J9621 Acute and chronic respiratory failure with hypoxia: Secondary | ICD-10-CM | POA: Insufficient documentation

## 2016-06-16 DIAGNOSIS — R000 Tachycardia, unspecified: Secondary | ICD-10-CM | POA: Insufficient documentation

## 2016-06-16 DIAGNOSIS — I469 Cardiac arrest, cause unspecified: Secondary | ICD-10-CM | POA: Insufficient documentation

## 2016-06-16 DIAGNOSIS — R404 Transient alteration of awareness: Secondary | ICD-10-CM

## 2016-06-16 DIAGNOSIS — R4189 Other symptoms and signs involving cognitive functions and awareness: Secondary | ICD-10-CM | POA: Insufficient documentation

## 2016-06-16 LAB — RAPID URINE DRUG SCREEN, HOSP PERFORMED
AMPHETAMINES: NOT DETECTED
BENZODIAZEPINES: POSITIVE — AB
Barbiturates: NOT DETECTED
COCAINE: NOT DETECTED
OPIATES: POSITIVE — AB
TETRAHYDROCANNABINOL: NOT DETECTED

## 2016-06-16 LAB — BASIC METABOLIC PANEL
ANION GAP: 9 (ref 5–15)
BUN: 9 mg/dL (ref 6–20)
CO2: 25 mmol/L (ref 22–32)
CREATININE: 0.74 mg/dL (ref 0.44–1.00)
Calcium: 8.5 mg/dL — ABNORMAL LOW (ref 8.9–10.3)
Chloride: 101 mmol/L (ref 101–111)
GFR calc non Af Amer: >60 mL/min (ref >60)
GLUCOSE: 136 mg/dL — AB (ref 65–99)
POTASSIUM: 4 mmol/L (ref 3.5–5.1)
Sodium: 135 mmol/L (ref 135–145)

## 2016-06-16 LAB — BLOOD GAS, ARTERIAL
Acid-Base Excess: 2.7 mmol/L — ABNORMAL HIGH (ref 0.0–2.0)
BICARBONATE: 26.7 meq/L — AB (ref 20.0–24.0)
DRAWN BY: 448981
FIO2: 0.21
O2 SAT: 91.4 %
PH ART: 7.434 (ref 7.350–7.450)
Patient temperature: 98.6
TCO2: 27.9 mmol/L (ref 0–100)
pCO2 arterial: 40.5 mmHg (ref 35.0–45.0)
pO2, Arterial: 60.2 mmHg — ABNORMAL LOW (ref 80.0–100.0)

## 2016-06-16 LAB — CBC WITH DIFFERENTIAL/PLATELET
BASOS ABS: 0 10*3/uL (ref 0.0–0.1)
BASOS PCT: 0 %
EOS PCT: 0 %
Eosinophils Absolute: 0 10*3/uL (ref 0.0–0.7)
HCT: 28.8 % — ABNORMAL LOW (ref 36.0–46.0)
Hemoglobin: 9.7 g/dL — ABNORMAL LOW (ref 12.0–15.0)
Lymphocytes Relative: 15 %
Lymphs Abs: 1.5 10*3/uL (ref 0.7–4.0)
MCH: 28.9 pg (ref 26.0–34.0)
MCHC: 33.7 g/dL (ref 30.0–36.0)
MCV: 85.7 fL (ref 78.0–100.0)
MONO ABS: 0.8 10*3/uL (ref 0.1–1.0)
Monocytes Relative: 8 %
NEUTROS ABS: 8.1 10*3/uL — AB (ref 1.7–7.7)
Neutrophils Relative %: 77 %
PLATELETS: 231 10*3/uL (ref 150–400)
RBC: 3.36 MIL/uL — AB (ref 3.87–5.11)
RDW: 12.7 % (ref 11.5–15.5)
WBC: 10.5 10*3/uL (ref 4.0–10.5)

## 2016-06-16 LAB — TROPONIN I
TROPONIN I: 0.17 ng/mL — AB (ref <0.03)
Troponin I: 0.15 ng/mL (ref <0.03)

## 2016-06-16 LAB — GENTAMICIN LEVEL, RANDOM: Gentamicin Rm: 3.1 ug/mL

## 2016-06-16 MED ORDER — ALPRAZOLAM 0.5 MG PO TABS
1.0000 mg | ORAL_TABLET | Freq: Every evening | ORAL | Status: DC | PRN
  Administered 2016-06-16 – 2016-06-17 (×2): 1 mg via ORAL
  Filled 2016-06-16 (×2): qty 2

## 2016-06-16 MED ORDER — ACETAMINOPHEN 500 MG PO TABS
1000.0000 mg | ORAL_TABLET | Freq: Once | ORAL | Status: AC
  Administered 2016-06-16: 1000 mg via ORAL
  Filled 2016-06-16: qty 2

## 2016-06-16 MED ORDER — NALOXONE HCL 0.4 MG/ML IJ SOLN: 0.4000 mg | INTRAMUSCULAR | Status: DC | PRN

## 2016-06-16 MED ORDER — OXYCODONE HCL 5 MG PO TABS
5.0000 mg | ORAL_TABLET | ORAL | Status: DC | PRN
  Administered 2016-06-16 – 2016-06-18 (×7): 10 mg via ORAL
  Filled 2016-06-16 (×7): qty 2

## 2016-06-16 MED ORDER — DILTIAZEM HCL 25 MG/5ML IV SOLN
5.0000 mg | INTRAVENOUS | Status: DC | PRN
  Administered 2016-06-16 – 2016-06-17 (×2): 5 mg via INTRAVENOUS
  Filled 2016-06-16 (×5): qty 5

## 2016-06-16 MED ORDER — OXYCODONE HCL 5 MG PO TABS: 5.0000 mg | ORAL_TABLET | ORAL | Status: DC | PRN

## 2016-06-16 MED ORDER — NALOXONE HCL 0.4 MG/ML IJ SOLN
INTRAMUSCULAR | Status: AC
  Administered 2016-06-16: 0.4 mg
  Filled 2016-06-16: qty 1

## 2016-06-16 MED ORDER — HYDROMORPHONE HCL 1 MG/ML IJ SOLN
1.0000 mg | Freq: Once | INTRAMUSCULAR | Status: AC
  Administered 2016-06-16: 1 mg via INTRAVENOUS
  Filled 2016-06-16: qty 1

## 2016-06-16 MED ORDER — MORPHINE SULFATE (PF) 2 MG/ML IV SOLN: 2.0000 mg | INTRAVENOUS | Status: DC | PRN

## 2016-06-16 MED ORDER — EPINEPHRINE HCL 1 MG/ML IJ SOLN
1.0000 mg | Freq: Once | INTRAMUSCULAR | Status: AC
Start: 2016-06-16 — End: 2016-06-16
  Administered 2016-06-16: 1 mg via INTRAVENOUS

## 2016-06-16 MED FILL — Medication: Qty: 1 | Status: AC

## 2016-06-16 NOTE — Progress Notes (Signed)
Chaplain responded to a code blue and provided emotional and spiritual support via prayer.

## 2016-06-16 NOTE — Progress Notes (Signed)
Subjective: 1 Day Post-Op Procedure(s) (LRB): OPEN REDUCTION INTERNAL FIXATION (ORIF) DISTAL RADIAL FRACTUREAND ULNA (Left) Patient reports pain as moderate.   Vitals are currently stable. She is here with her family (husband). She denies leg pain. She states her back is very painful. She denies abdominal pain or neck pain. Her right arm is nontender. Her left arm is status post major reconstruction and is tender.  Events of today are noted. She self medicated with her own medicines which are now on. Her husband has taken these from her. She states she was not trying to harm herself. She states she just wanted out of the pain in her body.  Thus it appears that her of events today were certainly due to self-administered medicines that she somehow had on her person. She admits to taking Roxicet in the past. She takes up to 30 mg a day. She states she typically takes this for back.  With a long conversation under bedside with her husband present and nursing staff present. She promises that she is not going to self administer any further medicines and that she does not have these with her to even administer at this point.  Thus I feel that her code/and conscious event today was due to self-medication as she admits. She woke up nearly immediately with the Narcan administration.  I've explained her surgery the issues and the findings.  I talked with Dr. Bevely Palmer of neurosurgery and his note is in the chart.  Objective: Vital signs in last 24 hours: Temp:  [97.7 F (36.5 C)-101.2 F (38.4 C)] 99.5 F (37.5 C) (07/19 1935) Pulse Rate:  [80-131] 123 (07/19 1935) Resp:  [14-27] 27 (07/19 1935) BP: (118-157)/(65-103) 155/98 mmHg (07/19 1935) SpO2:  [91 %-100 %] 97 % (07/19 1935)  Intake/Output from previous day: 07/18 0701 - 07/19 0700 In: 2800 [I.V.:2150; IV Piggyback:650] Out: 1610 [Urine:1530; Drains:15; Blood:65] Intake/Output this shift: Total I/O In: -  Out: 1000 [Urine:1000]   Recent  Labs  06/15/16 0855 06/15/16 0918 06/16/16 0753  HGB 12.0 12.2 9.7*    Recent Labs  06/15/16 0855 06/15/16 0918 06/16/16 0753  WBC 9.9  --  10.5  RBC 4.08  --  3.36*  HCT 35.7* 36.0 28.8*  PLT 345  --  231    Recent Labs  06/15/16 0855 06/15/16 0918 06/16/16 0753  NA 139 140 135  K 4.3 4.2 4.0  CL 106 104 101  CO2 25  --  25  BUN 20 25* 9  CREATININE 0.87 0.90 0.74  GLUCOSE 122* 123* 136*  CALCIUM 9.5  --  8.5*    Recent Labs  06/15/16 0855  INR 1.01  Physical exam: Her left upper extremity has intact refill. She is sensate about the radial median and ulnar nerves. She has swelling appropriate to her injury. She is on appropriate antibiotics. She has ability to flex and extend through a very limited arc of motion. She can raise both legs and has no leg pain. Her right arm is stable. She does complain of back pain. Her saturations are stable.  Neurologically intact ABD soft Neurovascular intact Sensation intact distally Intact pulses distally No cellulitis present Compartment soft  Assessment/Plan: 1 Day Post-Op Procedure(s) (LRB): OPEN REDUCTION INTERNAL FIXATION (ORIF) DISTAL RADIAL FRACTUREAND ULNA (Left)  I discussed with patient all issues. We will continue her Foley today. We will DC the Foley tomorrow. We'll try to get her up and mobilize. I discussed with her that it is imperative that she  begin range of motion to her fingers massage and elevation. I have discussed with her how to implement and placed the [facility].  We will change her pain medicine requirements and she promises to not self-administered. We will keep her in the stepdown unit for observatory care. We'll keep her on continuous O2 sat monitoring. I discussed this with nursing staff. I do feel that she will need some degree of pain control given her history of pain medicine use and the tremendous injury she is sustained. I so much appreciate the help of the hospitalist in this  patient.  All questions have been encouraged and answered. She understands the plan going forward.  Is difficult set a discharge date at this point.  I do feel she has some acute blood loss anemia as reflected in her labs.   Up with therapy  Karen ChafeGRAMIG III,Randilyn Foisy M 06/16/2016, 8:11 PM

## 2016-06-16 NOTE — Progress Notes (Signed)
Imaging reviewed.  T12 compression fracture.  Stable fracture.  No need for brace, although if the patient has significant back pain can use a TLSO. Follow up with me in 4 weeks.

## 2016-06-16 NOTE — Op Note (Signed)
NAMEMarland Kitchen  Angela Vaughan, Angela Vaughan NO.:  [id number]  MEDICAL RECORD NO.:  [id number]  LOCATION:                                 FACILITY:  PHYSICIAN:  Dionne Ano. Recardo Linn, M.D.DATE OF BIRTH:  16-Aug-1966  DATE OF PROCEDURE: DATE OF DISCHARGE:                              OPERATIVE REPORT   PREOPERATIVE DIAGNOSES:  Left open radius and ulna shaft fracture about the forearm.  This was a type 3 open injury secondary to dirty contamination and vascular laceration of the ulnar artery.  POSTOPERATIVE DIAGNOSES:  Left open radius and ulna shaft fracture about the forearm. This was a type 3 open injury secondary to dirty contamination and vascular laceration of the ulnar artery.  PROCEDURE: 1. Irrigation and debridement of skin, subcutaneous tissue, bone,     tendon, and muscle left forearm in about 2 separate areas, one area     was where the radius punctured through volar ulnarly and 1 was     about the ulna shaft fracture region overlying subcutaneous border     of the ulna (there were 2 open areas about the forearm).  This was     an excisional debridement with curette, knife, blade, and scissor. 2. Open reduction and internal fixation with DVR cross lock system     left radius fracture metaphyseal in nature. 3. Open reduction and internal fixation ulna shaft fracture with 7     hole 3.5 DCP plate from [employer]. 4. Open carpal tunnel release. 5. Fasciotomy, left forearm. 6. Ulnar artery repair, left wrist distal forearm region. 7. Ulnar nerve neurolysis extensive in nature.  SURGEON:  Dionne Ano. Amanda Pea, M.D.  ASSISTANT:  Karie Chimera, P.A.-C.  COMPLICATIONS:  None.  ANESTHESIA:  General with a preoperative block.  ESTIMATED BLOOD LOSS:  Minimal.  TOURNIQUET TIME:  Less than 2 hours.  INDICATIONS:  A 50 year old female, who fell off a ladder into the ground.  She has a dirty contamination and open volar ulna wound secondary to radial shaft puncture.  This was the  proximal end of the radius.  She also has open ulna shaft fracture about subcutaneous border of the ulna.  We have counseled in regard to risks and benefits of surgery and the above-mentioned surgical algorithm.  I have discussed her all issues and do's and don'ts.  OPERATIVE PROCEDURE:  AP, lateral and oblique x-rays performed, examined, and interpreted by myself, left wrist and forearm. The patient was seen in the operative theater.  Arm was marked.  She was counseled. Taken to the operative theater and underwent smooth induction of general anesthesia.  She was then prescrubbed with Hibiclens/chlorhexidine scrub.  Following this, 10 minute surgical Betadine scrub and paint was accomplished.  Once this was complete, we called the time-out and ensured that the antibiotics were given preoperatively in the form of gentamicin and Ancef.  At this time, we very carefully performed irrigation and debridement.  I removed 1 mm rim of skin about the volar ulnar wound and dissected down.  It was quite obvious this is where the radial shaft proximally had punctured through.  Skin, subcutaneous tissue, muscle, and tendon as well as bone was aggressively debrided  without difficulty.  Following this, I made a volar radial approach for an ORIF of the wrist and exposed bone through this window as well.  At this time, I scoured the area and placed 6 L of fluid through and through the wounds.  The patient had a very aggressive and meticulous debridement.  There was some small amounts of dirty contamination present.  I made sure there was absolutely no dirt or any foreign material present and any devitalized muscle or other abnormality was removed.  This was done with curette, knife, blade, and scissor. Following this, a similar incision was made over the subcutaneous border of the ulna, dissection was carried down to the fracture site.  This was an open fracture as well.  There was small amount of  dirty contamination, this was aggressively debrided.  Following aggressive debridement, excisional in nature with curette, knife, blade, and scissor, I then placed 6 L through and through this area as well.  Thus a total of 12 L were used, 6 L were used to each different open site. Following this, the drapes and towels were changed.  At this point in time, we then set about stabilizing the bony architecture.  Bony architecture was stabilized about the radius first.  We performed a DVR plate and screw construct.  She has had a prior radius fracture treated conservatively.  Outline of the radius as perfect as possible, and then applied the plate and screw construct to achieve radial height, inclination, and volar tilt to our satisfaction.  I was able to get an excellent reduction.  She had some metaphyseal combination.  I did not bone graft her given the open injury issues. Following AP, lateral and oblique x-rays confirming excellent reduction, I then set forth to fixate the ulnar shaft.  This was exposed carefully, taking care to preserve periosteal attachments.  A 7 hole DCP plate was placed in compression mold with a [employer] system.  A combination of nonlocking screws were placed first followed by locking screws.  The plate was pretensioned.  The fracture interdigitated nicely.  There was small amount of comminution but all looked quite well.  At this time, I performed fasciotomies of the volar forearm without difficulty utilizing scissors sliding technique.  There were no complicating features with this.  Once the fasciotomy was performed, I then performed a carpal tunnel release.  A 1 inch incision was made.  Carpal tunnel was dissected and opened off the ulnar leaflet region.  I made sure that there was complete release proximally and distally including portions of the antebrachial fascia of course.  Following this, I turned attention back towards the volar ulnar wound, there was  an obvious ulnar artery laceration.  This was reviewed.  The ulnar nerve was very gently evaluated with 4.0 loupe magnification and underwent a neurolysis extensive in nature, freeing it from hematoma and other tight confines.  Following release of the ulnar nerve and neurolysis, I then repaired the ulnar artery for repair, microscopic technique was used to freshen the ulnar artery and I established proximal and distal flow which was excellent.  I then placed an Acklin clamp and performed an 8-0 nylon circumferential repair.  I was able to get a nice double type repair and performed this portion of the operation with tourniquet deflated of course.  The patient tolerated this well.  We then irrigated with an additional 3 L.  There was a total of 15 L of irrigation placed in the areas.  The patient  tolerated this well.  Following this, we then very carefully and cautiously evaluated the area, compartments were soft.  She looked well and the wounds were then closed with 3-0 Prolene.  I did not place any Vicryl sutures or deep sutures, rather than sutures to repair the ulnar artery.  She was placed in a long-arm splint, taken to recovery room.  She had stable vascularity and no complicating features. Given her compression fracture about the T-spine, we very carefully placed her in step-down unit and we will place her on Ancef, gent and penicillin given the type 3 open injury and some degree of dirty contamination.  The patient tolerated the procedure well.  She will be admitted as mentioned and we will watch her closely. I have discussed all issues with the family and all questions have been encouraged and answered.     Dionne AnoWilliam M. Amanda PeaGramig, M.D.   ______________________________ Dionne AnoWilliam M. Amanda PeaGramig, M.D.    [facility] CenterWMG/MEDQ  D:  06/15/2016  T:  06/16/2016  Job:  [id number]

## 2016-06-16 NOTE — Consult Note (Signed)
Medical Consultation   Angela Vaughan  AVW:[id number]  DOB: 16-Mar-1966  DOA: 06/15/2016  PCP: Default, Provider, MD   Requesting physician: Dominica Severin, MD  Reason for consultation: Cardiac arrest.    History of Present Illness: Angela Vaughan is an 50 y.o. female who fell from a roof early yesterday morning. She sustained a displaced distal radial fracture / distal ulnar fracture and a T2 compression fracture . Patient underwent a repair of LUE fractures around 1am. Around 8am patient was found with a EMPTY home pill bottle of Oxycodone in her bed. Patient was lethargic and hypoxic with sats in the 30s. . Following that a code blue was activated around 1:30pm when patient was found to be unresponsive in [facility]. CPR was started. Of note, patient has oxycodone at 12:20pm. After a dose of Narcan and IV epinephrine patient regained consciousness / heartbeat.She was placed on a non-rebreather. Patient is still on NR mask but nods that she feel okay. Patient denies any prior history of such events. She denies preceding chest pain, shortness of breath. Denies history of cardiac or pulmonary problems at home. Patient states she doesn't think she took any of her home meds this am really can't remember. Husband in room and states that their children had been visiting patient this am and told husband patient had been "in and out of it all morning".    Review of Systems:  ROS As per HPI otherwise 10 point review of systems negative.    Past Medical History: Past Medical History  Diagnosis Date  . Hypertension     Past Surgical History: Past Surgical History  Procedure Laterality Date  . Tubal ligation    . Tubal ligation  1991  . Open reduction internal fixation (orif) distal radial fracture Left 06/15/2016    Procedure: OPEN REDUCTION INTERNAL FIXATION (ORIF) DISTAL RADIAL FRACTUREAND ULNA;  Surgeon: Dominica Severin, MD;  Location: [facility] OR;  Service: Orthopedics;  Laterality: Left;     Allergies:  No Known Allergies  Social History:  reports that she has never smoked. She does not have any smokeless tobacco history on file. She reports that she does not drink alcohol or use illicit drugs. Patient is married, lives with husband  Family History: Family History  Problem Relation Age of Onset  . Hypertension      Physical Exam: Filed Vitals:   06/16/16 0000 06/16/16 0329 06/16/16 0722 06/16/16 1229  BP: 156/91 149/80 131/66 127/73  Pulse: 124 125 131 108  Temp: 101.2 F (38.4 C) 99.9 F (37.7 C) 99.1 F (37.3 C) 98.4 F (36.9 C)  TempSrc: Oral Oral Oral Oral  Resp: 21 23 18 14   SpO2: 100% 97% 93% 96%   Constitutional: White female lying in bed in NAD. Alert and awake Eyes: PERLA, EOMI, irises appear normal, anicteric sclera ENMT: external ears and nose appear normal, normal hearing. Lips pale. On Nonrebreather.  Neck: neck appears normal, no masses, normal ROM, no thyromegaly, no JVD  CVS: S1-S2 clear, no murmur rubs or gallops, no LE edema, normal pedal pulses  Respiratory:  Bilateral inspiratory crackles. No wheezing, rales or rhonchi. On non-rebreather. No accessory muscle use.  Abdomen: soft nontender, nondistended, normal bowel sounds, no hepatosplenomegaly, no hernias  Musculoskeletal: : no cyanosis, clubbing or edema noted bilaterally. LUE surgical dressing.  Neuro: Alert and oriented. Normal bilateral upper and lower extremity strength.  Psych: judgement and insight appear normal, stable mood  and affect, mental status Skin: no rashes or lesions or ulcers, no induration or nodules   Data reviewed:  I have personally reviewed following labs and imaging studies  Labs:  CBC:  Recent Labs Lab 06/15/16 0855 06/15/16 0918 06/16/16 0753  WBC 9.9  --  10.5  NEUTROABS  --   --  8.1*  HGB 12.0 12.2 9.7*  HCT 35.7* 36.0 28.8*  MCV 87.5  --  85.7  PLT 345  --  231    Basic Metabolic Panel:  Recent Labs Lab 06/15/16 0855 06/15/16 0918  06/16/16 0753  NA 139 140 135  K 4.3 4.2 4.0  CL 106 104 101  CO2 25  --  25  GLUCOSE 122* 123* 136*  BUN 20 25* 9  CREATININE 0.87 0.90 0.74  CALCIUM 9.5  --  8.5*    Liver Function Tests:  Recent Labs Lab 06/15/16 0855  AST 27  ALT 18  ALKPHOS 48  BILITOT 0.3  PROT 6.4*  ALBUMIN 3.6    Coagulation profile  Recent Labs Lab 06/15/16 0855  INR 1.01    Urinalysis    Component Value Date/Time   COLORURINE YELLOW 06/15/2016 1254   APPEARANCEUR CLEAR 06/15/2016 1254   LABSPEC <1.005* 06/15/2016 1254   PHURINE 5.0 06/15/2016 1254   GLUCOSEU NEGATIVE 06/15/2016 1254   HGBUR LARGE* 06/15/2016 1254   BILIRUBINUR NEGATIVE 06/15/2016 1254   KETONESUR NEGATIVE 06/15/2016 1254   PROTEINUR NEGATIVE 06/15/2016 1254   UROBILINOGEN 0.2 07/09/2013 1050   NITRITE NEGATIVE 06/15/2016 1254   LEUKOCYTESUR NEGATIVE 06/15/2016 1254    Inpatient Medications:   Scheduled Meds: .  ceFAZolin (ANCEF) IV  1 g Intravenous Q8H  . docusate sodium  100 mg Oral BID  . gentamicin  500 mg Intravenous Q24H  . vitamin C  1,000 mg Oral Daily   Continuous Infusions: . lactated ringers 100 mL/hr at 06/15/16 2200   Radiological Exams on Admission: Dg Forearm Left  06/15/2016  CLINICAL DATA:  Fall from roof this morning with obvious pain and deformity left wrist. EXAM: LEFT FOREARM - 2 VIEW COMPARISON:  None. FINDINGS: There is a significantly displaced transverse fracture of the distal radial metaphysis with 1 shaft's with of radial displacement and 2 shaft's with dorsal displacement of the distal fragment. Mild dorsal angulation of the distal fragment. Displaced transverse fracture of the distal ulnar diaphysis with 1 shaft's with of radial and dorsal displacement of the distal fragment as well as dorsal angulation of the distal fragment. Displaced ulnar styloid fracture. IMPRESSION: Significantly displaced distal radial metaphyseal fracture and distal ulnar diaphyseal fractures as described.  Ulnar styloid fracture. Electronically Signed   By: Elberta Fortis M.D.   On: 06/15/2016 09:54   Dg Wrist 2 Views Left  06/15/2016  CLINICAL DATA:  Fall from roof this morning with pain and deformity left wrist. EXAM: LEFT WRIST - 2 VIEW COMPARISON:  03/24/2011 FINDINGS: There is transverse fracture of the distal radial metaphysis with significant displacement. One shaft's with of radial displacement with 2 shaft's with of posterior displacement of the distal fragment. There is a displaced transverse fracture of the distal diaphysis of the ulna with approximately 1 shaft's with of radial and dorsal displacement of the distal fragment. There is dorsal angulation both the distal radial and ulnar fragments. Displaced ulnar styloid fracture. IMPRESSION: Significantly displaced distal radial metaphyseal fracture as well as displaced distal ulnar diaphyseal fracture as described. Displaced ulnar styloid fracture. Electronically Signed   By: Elberta Fortis  M.D.   On: 06/15/2016 09:52   Ct Head Wo Contrast  06/15/2016  CLINICAL DATA:  Fall from roof this morning. EXAM: CT HEAD WITHOUT CONTRAST CT CERVICAL SPINE WITHOUT CONTRAST TECHNIQUE: Multidetector CT imaging of the head and cervical spine was performed following the standard protocol without intravenous contrast. Multiplanar CT image reconstructions of the cervical spine were also generated. COMPARISON:  None. FINDINGS: CT HEAD FINDINGS Ventricles, cisterns and other CSF spaces are within normal. There is no mass, mass effect, shift of midline structures or acute hemorrhage. No evidence of acute infarction. Subtle opacification over the ethmoid sinus. Possible subtle fracture of the nasal bone which may be acute or chronic. Deviation of the nasal septum to the left. CT CERVICAL SPINE FINDINGS Vertebral body alignment, heights and disc space heights are within normal. Very minimal spondylosis of the mid to lower cervical spine. Prevertebral soft tissues are normal.  The atlantoaxial articulation is normal. Minimal uncovertebral joint spurring. No acute fracture or subluxation. Remainder of the exam is within normal. IMPRESSION: No acute intracranial findings. Possible subtle fracture of the nasal bone which may be acute or chronic. Opacification in the ethmoid sinus. No acute cervical spine injury. Subtle spondylosis of the mid to lower cervical spine. Electronically Signed   By: Elberta Fortisaniel  Boyle M.D.   On: 06/15/2016 10:44   Ct Chest W Contrast  06/15/2016  CLINICAL DATA:  Fall from roof.  Initial encounter. EXAM: CT CHEST, ABDOMEN AND PELVIS WITHOUT CONTRAST TECHNIQUE: Multidetector CT imaging of the chest, abdomen and pelvis was performed following the standard protocol without IV contrast. COMPARISON:  None. FINDINGS: CT CHEST FINDINGS Mediastinum/Lymph Nodes: There is no axillary lymphadenopathy. No mediastinal lymphadenopathy. There is no hilar lymphadenopathy. No evidence for mediastinal hemorrhage. Although not dedicated CTA exam, no irregular wall thickening in the thoracic aorta. No evidence for dissection of the thoracic aorta. The heart size is normal. No pericardial effusion. The esophagus has normal imaging features. Lungs/Pleura: No evidence for pneumothorax. No focal lung contusion. No evidence for posttraumatic spermatocele. No pleural effusion. 5 mm right middle lobe pulmonary nodule is seen on image 63 series 3. 4 mm triangular-shaped perifissural nodule seen on image 61 series 3 is probably a subpleural lymph node. Musculoskeletal: No evidence for rib fracture. T12 superior endplate compression fracture has acute appearance by CT. Fracture results in less than 25% loss of height anteriorly and posteriorly with central endplate depression. 3-4 mm posterior bony retropulsion of posterior cortex identified in the upper T12 vertebral body. CT ABDOMEN PELVIS FINDINGS Hepatobiliary: No focal abnormality within the liver parenchyma. There is no evidence for  gallstones, gallbladder wall thickening, or pericholecystic fluid. Common bile duct measures 7-8 mm diameter in the pancreatic head. Pancreas: No focal mass lesion. No dilatation of the main duct. No intraparenchymal cyst. No peripancreatic edema. Spleen: No splenomegaly. No focal mass lesion. Adrenals/Urinary Tract: No adrenal nodule or mass. Kidneys show symmetric perfusion and excretion. No perinephric edema or fluid collection. No evidence for hydroureter. The urinary bladder appears normal for the degree of distention. Stomach/Bowel: Stomach is nondistended. No gastric wall thickening. No evidence of outlet obstruction. Duodenum is normally positioned as is the ligament of Treitz. No small bowel wall thickening. No small bowel dilatation. The terminal ileum is normal. No gross colonic mass. No colonic wall thickening. No substantial diverticular change. Vascular/Lymphatic: No abdominal aortic aneurysm. No abdominal aortic atherosclerotic calcification. There is no gastrohepatic or hepatoduodenal ligament lymphadenopathy. No intraperitoneal or retroperitoneal lymphadenopathy. No pelvic sidewall lymphadenopathy. Reproductive: Endometrial  stripe is somewhat prominent which may be within normal limits for age. There is no adnexal mass. Other: No intraperitoneal free fluid. Musculoskeletal: T12 compression fracture is mentioned above. Otherwise no lumbar spine or bony pelvic fracture. IMPRESSION: 1. T12 compression fracture as described. 2. No evidence for non bony acute traumatic organ injury in the chest, abdomen, or pelvis. No intraperitoneal free fluid. 3. Mild dilatation of the common bile duct without identifiable a etiology. No associated dilatation of the main pancreatic duct. Correlation with liver function test may prove helpful. 4. 5 mm right middle lobe pulmonary nodule No follow-up needed if patient is low-risk. Non-contrast chest CT can be considered in 12 months if patient is high-risk. This  recommendation follows the consensus statement: Guidelines for Management of Incidental Pulmonary Nodules Detected on CT Images:From the Fleischner Society 2017; published online before print (10.1148/[id number]). Electronically Signed   By: Kennith Center M.D.   On: 06/15/2016 11:08   Ct Cervical Spine Wo Contrast  06/15/2016  CLINICAL DATA:  Fall from roof this morning. EXAM: CT HEAD WITHOUT CONTRAST CT CERVICAL SPINE WITHOUT CONTRAST TECHNIQUE: Multidetector CT imaging of the head and cervical spine was performed following the standard protocol without intravenous contrast. Multiplanar CT image reconstructions of the cervical spine were also generated. COMPARISON:  None. FINDINGS: CT HEAD FINDINGS Ventricles, cisterns and other CSF spaces are within normal. There is no mass, mass effect, shift of midline structures or acute hemorrhage. No evidence of acute infarction. Subtle opacification over the ethmoid sinus. Possible subtle fracture of the nasal bone which may be acute or chronic. Deviation of the nasal septum to the left. CT CERVICAL SPINE FINDINGS Vertebral body alignment, heights and disc space heights are within normal. Very minimal spondylosis of the mid to lower cervical spine. Prevertebral soft tissues are normal. The atlantoaxial articulation is normal. Minimal uncovertebral joint spurring. No acute fracture or subluxation. Remainder of the exam is within normal. IMPRESSION: No acute intracranial findings. Possible subtle fracture of the nasal bone which may be acute or chronic. Opacification in the ethmoid sinus. No acute cervical spine injury. Subtle spondylosis of the mid to lower cervical spine. Electronically Signed   By: Elberta Fortis M.D.   On: 06/15/2016 10:44   Ct Abdomen Pelvis W Contrast  06/15/2016  CLINICAL DATA:  Fall from roof.  Initial encounter. EXAM: CT CHEST, ABDOMEN AND PELVIS WITHOUT CONTRAST TECHNIQUE: Multidetector CT imaging of the chest, abdomen and pelvis was  performed following the standard protocol without IV contrast. COMPARISON:  None. FINDINGS: CT CHEST FINDINGS Mediastinum/Lymph Nodes: There is no axillary lymphadenopathy. No mediastinal lymphadenopathy. There is no hilar lymphadenopathy. No evidence for mediastinal hemorrhage. Although not dedicated CTA exam, no irregular wall thickening in the thoracic aorta. No evidence for dissection of the thoracic aorta. The heart size is normal. No pericardial effusion. The esophagus has normal imaging features. Lungs/Pleura: No evidence for pneumothorax. No focal lung contusion. No evidence for posttraumatic spermatocele. No pleural effusion. 5 mm right middle lobe pulmonary nodule is seen on image 63 series 3. 4 mm triangular-shaped perifissural nodule seen on image 61 series 3 is probably a subpleural lymph node. Musculoskeletal: No evidence for rib fracture. T12 superior endplate compression fracture has acute appearance by CT. Fracture results in less than 25% loss of height anteriorly and posteriorly with central endplate depression. 3-4 mm posterior bony retropulsion of posterior cortex identified in the upper T12 vertebral body. CT ABDOMEN PELVIS FINDINGS Hepatobiliary: No focal abnormality within the liver parenchyma. There is  no evidence for gallstones, gallbladder wall thickening, or pericholecystic fluid. Common bile duct measures 7-8 mm diameter in the pancreatic head. Pancreas: No focal mass lesion. No dilatation of the main duct. No intraparenchymal cyst. No peripancreatic edema. Spleen: No splenomegaly. No focal mass lesion. Adrenals/Urinary Tract: No adrenal nodule or mass. Kidneys show symmetric perfusion and excretion. No perinephric edema or fluid collection. No evidence for hydroureter. The urinary bladder appears normal for the degree of distention. Stomach/Bowel: Stomach is nondistended. No gastric wall thickening. No evidence of outlet obstruction. Duodenum is normally positioned as is the ligament of  Treitz. No small bowel wall thickening. No small bowel dilatation. The terminal ileum is normal. No gross colonic mass. No colonic wall thickening. No substantial diverticular change. Vascular/Lymphatic: No abdominal aortic aneurysm. No abdominal aortic atherosclerotic calcification. There is no gastrohepatic or hepatoduodenal ligament lymphadenopathy. No intraperitoneal or retroperitoneal lymphadenopathy. No pelvic sidewall lymphadenopathy. Reproductive: Endometrial stripe is somewhat prominent which may be within normal limits for age. There is no adnexal mass. Other: No intraperitoneal free fluid. Musculoskeletal: T12 compression fracture is mentioned above. Otherwise no lumbar spine or bony pelvic fracture. IMPRESSION: 1. T12 compression fracture as described. 2. No evidence for non bony acute traumatic organ injury in the chest, abdomen, or pelvis. No intraperitoneal free fluid. 3. Mild dilatation of the common bile duct without identifiable a etiology. No associated dilatation of the main pancreatic duct. Correlation with liver function test may prove helpful. 4. 5 mm right middle lobe pulmonary nodule No follow-up needed if patient is low-risk. Non-contrast chest CT can be considered in 12 months if patient is high-risk. This recommendation follows the consensus statement: Guidelines for Management of Incidental Pulmonary Nodules Detected on CT Images:From the Fleischner Society 2017; published online before print (10.1148/radiol.0981191478). Electronically Signed   By: Kennith Center M.D.   On: 06/15/2016 11:08    Impression/Recommendations Active Problems:   S/P ORIF (open reduction internal fixation) fracture   Open left forearm fracture  Cardiac arrest. Found in [facility]A this am, Nurse found empty pill bottle of home Oxycodone in patient's bed this am. Patient regained consciousness after dose on Epi and Narcan. Patient cannot remember if she took any home medications Denies prior history of such though  I found in Epic that she was brought to ED after being found unresponsive after taking xanax and opiates.  -stat CXR  -Stop Non-rebreather -ABGs once off NR for an hour. -IV narcan 0.4mg  x 1 prn. Call MD if administered.  -UDS now -echocardiogram -CXR  ORIF 06/16/16 yesterday for left arm fracture following a fall.    Thank you for this consultation.  Our [facility] hospitalist team will follow the patient with you.   Time Spent: 45 minutes  Willette Cluster M.D. [facility] 06/16/2016, 3:09 PM

## 2016-06-16 NOTE — Progress Notes (Addendum)
Turned patient to reposition found blue pill and identified it as xanax. Pt did have xanax given 07/18 at 2300. Unsure, if this was her personal medicine that was found or if this was from 7/18 and she did not take it. Will continue to monitor.   WIS with Osvaldo Humanarole Schiller, RN

## 2016-06-16 NOTE — Progress Notes (Signed)
Pharmacy Antibiotic Note  Angela Vaughan is a 50 y.o. female admitted on 06/15/2016 s/p fall from roof. She remains on antibiotics s/p ORIF of L arm fracture. She is on day #2 of Ancef and gentamicin. 12-hour level was therapeutic at 3.1. She also received a pre-op dose of gentamicin,  the trough is likely a little higher due to this. SCr 0.7 stable.   Plan: -Continue gentamicin 500 mg IV q24h -Recheck levels as needed -F/u duration of therapy    Temp (24hrs), Avg:99.3 F (37.4 C), Min:97.7 F (36.5 C), Max:101.2 F (38.4 C)   Recent Labs Lab 06/15/16 0855 06/15/16 0918 06/15/16 0919 06/16/16 0753 06/16/16 1428  WBC 9.9  --   --  10.5  --   CREATININE 0.87 0.90  --  0.74  --   LATICACIDVEN  --   --  1.62  --   --   GENTRANDOM  --   --   --   --  3.1    CrCl cannot be calculated (Unknown ideal weight.).    No Known Allergies  Antimicrobials this admission: 7/18 Ancef >> 7/18 gent >> 7/18 Pen G x2 doses  Dose adjustments this admission: 7/19 gent random: 3.1  Microbiology results: None  Thank you for allowing pharmacy to be a part of this patient's care.  Baldemar FridayMasters, Bradi Arbuthnot M 06/16/2016 7:10 PM

## 2016-06-16 NOTE — Progress Notes (Signed)
Orthopedic Tech Progress Note Patient Details:  Angela Vaughan [birth year] [id number]  Patient ID: Angela Vaughan, female   DOB: [birth year], 50 y.o.   MRN: [id number] Called in bio-tech brace order; spoke with Anderson MaltaStephanie  [name] 06/16/2016, 9:29 AM

## 2016-06-16 NOTE — Progress Notes (Signed)
Found patient with home pill bottle of oxycodone in bed, no pills found and no pills in bottle. Question patient as to if she took her home medicine. She is lethargic, but arousal. C/o pain-will hold meds until more alert.

## 2016-06-16 NOTE — Progress Notes (Addendum)
Occupational Therapy Evaluation Patient Details Name: Angela Vaughan MRN: [id number] DOB: 20-Oct-1966 Today's Date: 06/16/2016    History of Present Illness 50 year old female who unfortunately sustained a fall from a 1 story roof earlier today. sustained a displaced distal radius fracture - s/p ORIF; fasciotomy; 06/15/18. T2 compression fracture about the superior endplate;ulnar artery repair; ulnar n neurolysis   Clinical Impression   PTA, pt independent with ADL and mobility. Pt seen at bed level due to waiting on TLSO and current bedrest.  Assessed LUE. On entry, Pt positioned with LUE in dependent position and complaining of 10/10 pain. Pt educated on importance of elevating LUE, using ice and frequently moving fingers. Pt positioned properly.  Also began education on back precautions. Discussed with nsg and planned to return this pm once TLSO arrived and remove bedrest orders to mobilize pt OOB and complete evaluation. Pt lethargic during assessment, but able to carry on a conversation, give home information and ask for more pain medicine. Apparently code called after session and Dr. Bevely Palmeritty stated that a back brace is not needed at this time. Will follow up with pt in am. Pt will most likely be able to D/C home when stable.     Follow Up Recommendations  Supervision - Intermittent    Equipment Recommendations  3 in 1 bedside comode    Recommendations for Other Services  PT EVAL     Precautions / Restrictions Precautions Precautions: Back;Fall Required Braces or Orthoses: Spinal Brace (per Dr. Bevely Palmeritty, no need for brace) Spinal Brace: Thoracolumbosacral orthotic (see above) Restrictions Weight Bearing Restrictions: No      Mobility Bed Mobility Overal bed mobility: Needs Assistance Bed Mobility: Rolling           General bed mobility comments: Pt rolling around in bed. Educated to roll using log rolling technique  Transfers                 General transfer comment:  waiting on brace. Redrest orders    Balance                                            ADL Overall ADL's : Needs assistance/impaired      Will further assess next session                               Functional mobility during ADLs:  (will return to assess) General ADL Comments: focus of session on managment of LUE. Began educating pt on back precautions. Pt too lethargic to retain information and began crying because her husband was not with her. Will plan to return when TLSO arrives.     Vision     Perception     Praxis      Pertinent Vitals/Pain Pain Assessment: 0-10 Pain Score: 10-Worst pain ever Pain Location: LUE Pain Descriptors / Indicators: Aching;Burning;Constant Pain Intervention(s): Limited activity within patient's tolerance;Repositioned;Ice applied;Patient requesting pain meds-RN notified     Hand Dominance Right   Extremity/Trunk Assessment Upper Extremity Assessment Upper Extremity Assessment: LUE deficits/detail LUE Deficits / Details: Pt very painful with any movement of L digits. edematous LUE: Unable to fully assess due to pain LUE Coordination: decreased fine motor;decreased gross motor   Lower Extremity Assessment Lower Extremity Assessment: Defer to PT evaluation   Cervical / Trunk Assessment Cervical / Trunk  Assessment: Other exceptions (T12 compression fx)   Communication Communication Communication: No difficulties   Cognition Arousal/Alertness: Lethargic;Suspect due to medications Behavior During Therapy: [facility] for tasks assessed/performed Overall Cognitive Status:  (need to further assess)                     General Comments   Pt states she will be alone during the day.    Exercises Exercises: Other exercises Other Exercises Other Exercises: elevated/ice and encouraged frequent movement of digits   Shoulder Instructions      Home Living Family/patient expects to be discharged to::  Private residence Living Arrangements: Spouse/significant other Available Help at Discharge: Family;Available PRN/intermittently Type of Home: House Home Access: Stairs to enter [employer] of Steps: 3 Entrance Stairs-Rails: Right Home Layout: One level     Bathroom Shower/Tub: Producer, television/film/video: Standard Bathroom Accessibility: Yes How Accessible: Accessible via walker Home Equipment: None          Prior Functioning/Environment Level of Independence: Independent             OT Diagnosis: Generalized weakness;Acute pain   OT Problem List: Decreased strength;Decreased range of motion;Decreased activity tolerance;Decreased coordination;Decreased knowledge of use of DME or AE;Decreased knowledge of precautions;Obesity;Impaired UE functional use;Pain;Increased edema   OT Treatment/Interventions: Self-care/ADL training;DME and/or AE instruction;Therapeutic activities;Therapeutic exercise;Patient/family education    OT Goals(Current goals can be found in the care plan section) Acute Rehab OT Goals Patient Stated Goal: to not be in pain OT Goal Formulation: With patient Time For Goal Achievement: 06/30/16 Potential to Achieve Goals: Good  OT Frequency: Min 3X/week   Barriers to D/C:            Co-evaluation              End of Session Nurse Communication: Precautions;Patient requests pain meds;Other (comment) (need for TLSO and lift bedrest orders if appropriate)  Activity Tolerance: Patient limited by pain;Patient limited by lethargy Patient left: in bed;with call bell/phone within reach;with bed alarm set   Time: 1030-1045 OT Time Calculation (min): 15 min Charges:  OT General Charges $OT Visit: 1 Procedure OT Evaluation $OT Eval Moderate Complexity: 1 Procedure G-Codes:    Aniza Shor,HILLARY 07-05-16, 2:52 PM   [facility], OTR/L  [phone] 2016-07-05

## 2016-06-16 NOTE — Progress Notes (Signed)
Pt arrived from PACU to 3s05 after report.  Pt complains of pain, numbness of L hand, but can move and good cap refill.  A&Ox4, call bell within reach, family is bedside.  Will continue to monitor.

## 2016-06-16 NOTE — Code Documentation (Signed)
PCCM Attending Code Note: I was walking by the patient's room when nursing staff called for help and activated a code blue. Patient was hypoxic and ashen in color. She was in PEA without a palpable pulse and nonresponsive. Family was at bedside on arrival and were escorted out of the room. CPR was started and pads were placed. Patient's nurse reported she was found with an empty pill bottle in her bed earlier this morning and denied having taken any extra medication. Recently had received some oral Oxycodone. IV Narcan was administered along with IV Epinephrine. Patient regained consciousness and spontaneous circulation. She was following commands and moving extremities. Patient currently on nonrebreather mask. Requested bedside nurse contact attending physician for further recommendations and orders.   Donna ChristenJennings E. Jamison NeighborNestor, M.D. [facility] Pager:  [phone] After 3pm or if no response, call [phone] 1:25 PM 06/16/2016

## 2016-06-16 NOTE — Progress Notes (Signed)
ChiropodistAssistant Director walked into pt room when O2 sats dropped into the 30's. Pt was ashen and not responsive. Code was called and CPR started immediately. Dr. Jamison NeighborNestor was next door an able to assist during the code. As well as RRT.  After 0.4mg  of narcan and 1mg  of epi and continuous CPR-the patient aroused and become lethargically responsive.  Code ended with patient being placed on a NRB.  Dr. Amanda PeaGramig made aware of event, new orders provided. Will continue monitor.

## 2016-06-16 NOTE — Care Management Note (Signed)
Case Management Note  Patient Details  Name: Angela Vaughan MRN: [id number] Date of Birth: 1966/10/03  Subjective/Objective:    Patient is from home with spouse, s/p orif of ulnar and radius , also has t12 fx,  NCM [facility], since [facility] was not taking apts this week.  Patient states she will need medication ast at discharge as well. NCM will cont to follow for her dc needs.                Action/Plan:   Expected Discharge Date:                  Expected Discharge Plan:  Home/Self Care  In-House Referral:     Discharge planning Services  CM Consult  Post Acute Care Choice:    Choice offered to:     DME Arranged:    DME Agency:     HH Arranged:    HH Agency:     Status of Service:  In process, will continue to follow  If discussed at Long Length of Stay Meetings, dates discussed:    Additional Comments:  Leone Havenaylor, Jennings Corado Clinton, RN 06/16/2016, 11:53 AM

## 2016-06-16 NOTE — Progress Notes (Signed)
Pt c/o of 10/10 pain, HR in 130s, states her arm/hand feels tight.  Provider on call notified of situation.  Orders given.  Will continue to elevate, apply ice to L arm, and monitor.

## 2016-06-17 ENCOUNTER — Inpatient Hospital Stay (HOSPITAL_COMMUNITY): Payer: MEDICAID

## 2016-06-17 DIAGNOSIS — J9621 Acute and chronic respiratory failure with hypoxia: Secondary | ICD-10-CM

## 2016-06-17 DIAGNOSIS — I469 Cardiac arrest, cause unspecified: Secondary | ICD-10-CM

## 2016-06-17 DIAGNOSIS — D62 Acute posthemorrhagic anemia: Secondary | ICD-10-CM

## 2016-06-17 DIAGNOSIS — R000 Tachycardia, unspecified: Secondary | ICD-10-CM

## 2016-06-17 DIAGNOSIS — S5292XS Unspecified fracture of left forearm, sequela: Secondary | ICD-10-CM

## 2016-06-17 LAB — CBC
HEMATOCRIT: 26.1 % — AB (ref 36.0–46.0)
Hemoglobin: 8.7 g/dL — ABNORMAL LOW (ref 12.0–15.0)
MCH: 28.7 pg (ref 26.0–34.0)
MCHC: 33.3 g/dL (ref 30.0–36.0)
MCV: 86.1 fL (ref 78.0–100.0)
PLATELETS: 235 10*3/uL (ref 150–400)
RBC: 3.03 MIL/uL — ABNORMAL LOW (ref 3.87–5.11)
RDW: 12.6 % (ref 11.5–15.5)
WBC: 8.3 10*3/uL (ref 4.0–10.5)

## 2016-06-17 LAB — MRSA PCR SCREENING: MRSA BY PCR: NEGATIVE

## 2016-06-17 LAB — TROPONIN I: TROPONIN I: 0.1 ng/mL — AB (ref <0.03)

## 2016-06-17 LAB — ECHOCARDIOGRAM COMPLETE

## 2016-06-17 MED ORDER — ACETAMINOPHEN 325 MG PO TABS
650.0000 mg | ORAL_TABLET | Freq: Four times a day (QID) | ORAL | Status: DC | PRN
  Administered 2016-06-17 (×2): 650 mg via ORAL
  Filled 2016-06-17 (×2): qty 2

## 2016-06-17 NOTE — Progress Notes (Signed)
Occupational Therapy Treatment Patient Details Name: Angela Vaughan MRN: [id number] DOB: January 01, 1966 Today's Date: 06/17/2016    History of present illness 50 year old female who unfortunately sustained a fall from a 1 story roof earlier today. sustained a displaced distal radius fracture - s/p ORIF; fasciotomy; 06/15/18. T2 compression fracture about the superior endplate;ulnar artery repair; ulnar n neurolysis. On 7/19, Around 8 am, patient was found with an empty home pill bottle of Oxycodone in her bed. Patient was lethargic and hypoxic with sats in the 30s. Code blue was activated around 1:30pm when patient was found to be unresponsive in PEA. CPR was started. Of note, patient had oxycodone at 12:20 pm. After a dose of Narcan and IV epinephrine patient regained consciousness   OT comments  Pt more alert today. Per Dr.Ditty's note, pt can use TLSO for comfort. TLSO removed for ADL. Began education on compensatory techniques for ADL and safety for mobility. Pt unsteady at times, but overall did well. Pt will need to be mod I level prior to D/C home due to limited caregiver support and pt will not be eligible for home health after D/C. Pt states her back felt better with her brace on, therefore, brace repositioned at end of session. Pt with edematous L hand. Encouraged frequent ROM L digits, ice and elevation. Will follow acutely to maximize functional level of independence and facilitate safe D/C home.  Follow Up Recommendations  Supervision - Intermittent    Equipment Recommendations  3 in 1 bedside comode    Recommendations for Other Services      Precautions / Restrictions Precautions Precautions: Back;Fall Required Braces or Orthoses: Spinal Brace Spinal Brace: Thoracolumbosacral orthotic (for comfort) Restrictions Weight Bearing Restrictions: Yes Other Position/Activity Restrictions: kept non weight bearing LUE though no orders in chairt       Mobility Bed Mobility                General bed mobility comments: Pt up in chair  Transfers Overall transfer level: Needs assistance   Transfers: Sit to/from Stand Sit to Stand: Min guard              Balance Overall balance assessment: Needs assistance   Sitting balance-Leahy Scale: Good       Standing balance-Leahy Scale: Fair                     ADL Overall ADL's : Needs assistance/impaired Eating/Feeding: Set up   Grooming: Set up;Standing   Upper Body Bathing: Minimal assitance;Standing   Lower Body Bathing: Minimal assistance;Sit to/from stand   Upper Body Dressing : Sitting;Moderate assistance (to donn TLSO)   Lower Body Dressing: Minimal assistance;Sit to/from stand   Toilet Transfer: Minimal assistance;Ambulation Toilet Transfer Details (indicate cue type and reason): HHA due to unsteadiness Toileting- Clothing Manipulation and Hygiene: Supervision/safety;Sitting/lateral lean       Functional mobility during ADLs: Minimal assistance;Cueing for safety General ADL Comments: unsteady during ambulation at times  - improved as pt was up longer      Vision                     Perception     Praxis      Cognition   Behavior During Therapy: [facility] for tasks assessed/performed Overall Cognitive Status: No family/caregiver present to determine baseline cognitive functioning                       Extremity/Trunk Assessment  Exercises Other Exercises Other Exercises: elevated/ice and encouraged frequent movement of digits   Shoulder Instructions       General Comments  Pt became very emotional during session, stating that her husband is not very supportive and blames her for falling off of the roof. She feels hurt because he is not here with her and showing her support. She states she was laughing at her last night when she tried to tell him her feelings. Pt states she does not feel in danger. States her parents do not know she is in the  hospital. States she son was killed in a motorcycle accident 10 years ago, but she has another son, but he is not a caregiver.     Pertinent Vitals/ Pain       Pain Assessment: 0-10 Pain Score: 7  Pain Location: LUE/back Pain Descriptors / Indicators: Aching Pain Intervention(s): Limited activity within patient's tolerance;Repositioned  Home Living                                          Prior Functioning/Environment              Frequency Min 3X/week     Progress Toward Goals  OT Goals(current goals can now be found in the care plan section)  Progress towards OT goals: Progressing toward goals  Acute Rehab OT Goals Patient Stated Goal: to not be in pain OT Goal Formulation: With patient Time For Goal Achievement: 06/30/16 Potential to Achieve Goals: Good ADL Goals Pt Will Perform Upper Body Bathing: with supervision;with set-up;with caregiver independent in assisting;sitting Pt Will Perform Lower Body Bathing: with set-up;with supervision;sitting/lateral leans;with caregiver independent in assisting;sit to/from stand;with adaptive equipment Pt Will Perform Upper Body Dressing: with set-up;with supervision;sitting;with caregiver independent in assisting Pt Will Perform Lower Body Dressing: with set-up;with supervision;with caregiver independent in assisting;with adaptive equipment;sit to/from stand;sitting/lateral leans Pt Will Transfer to Toilet: with supervision;ambulating Pt Will Perform Toileting - Clothing Manipulation and hygiene: with caregiver independent in assisting;sit to/from stand;with adaptive equipment Pt/caregiver will Perform Home Exercise Program: Increased ROM;Left upper extremity;With Supervision;With written HEP provided Additional ADL Goal #1: Pt will verbalize understanding of 3/3 back precautions for comfort during ADL  Plan Discharge plan remains appropriate    Co-evaluation                 End of Session Equipment  Utilized During Treatment: Gait belt   Activity Tolerance Patient tolerated treatment well   Patient Left in chair;with call bell/phone within reach   Nurse Communication Mobility status        Time: 1020-1058 OT Time Calculation (min): 38 min  Charges: OT General Charges $OT Visit: 1 Procedure OT Treatments $Self Care/Home Management : 38-52 mins  Wolfe Camarena,HILLARY 06/17/2016, 11:57 AM   Luisa DagoHilary Mella Inclan, OTR/L  [phone] 06/17/2016 Alazne Quant, OTR/L  [phone] 06/17/2016

## 2016-06-17 NOTE — Evaluation (Signed)
Physical Therapy Evaluation Patient Details Name: Angela Vaughan MRN: [id number] DOB: Dec 03, 1965 Today's Date: 06/17/2016   History of Present Illness  50 year old female who unfortunately sustained a fall from a 1 story roof earlier today. sustained a displaced distal radius fracture - s/p ORIF; fasciotomy; 06/15/18. T2 compression fracture about the superior endplate;ulnar artery repair; ulnar n neurolysis. on 7/19, Around 8 am patient was found with an empty home pill bottle of Oxycodone in her bed. Patient was lethargic and hypoxic with sats in the 30s. Code blue was activated around 1:30pm when patient was found to be unresponsive in PEA. CPR was started. Of note, patient had oxycodone at 12:20 pm. After a dose of Narcan and IV epinephrine patient regained consciousness.  Pt with significant PMHx of HTN.   Clinical Impression  Pt is significantly unsteady during gait on her feet.  Pt reports dizziness and was found to have some right beating nystagmus with right gaze.  She may have some vertigo that will be further assessed next session.  Pt would benefit if able to qualify for, HHPT for balance and vestibular rehab.   Follow Up Recommendations Home health PT;Supervision for mobility/OOB    Equipment Recommendations  Cane    Recommendations for Other Services   NA    Precautions / Restrictions Precautions Precautions: Back;Fall Precaution Comments: reviewed log roll for back protection to get EOB.  Required Braces or Orthoses: Spinal Brace Spinal Brace: Thoracolumbosacral orthotic (for comfort) Restrictions Weight Bearing Restrictions: Yes RUE Weight Bearing: Non weight bearing (no orders for, but presumed) Other Position/Activity Restrictions: kept non weight bearing LUE though no orders in chairt      Mobility  Bed Mobility Overal bed mobility: Needs Assistance Bed Mobility: Rolling;Sidelying to Sit Rolling: Supervision Sidelying to sit: Min assist       General bed  mobility comments: Min assist to help support trunk during transition from right side lying to sitting EOB.  Pt reports her bed is situated for her to get out of the left side.  I asked if this could be re-arranged.    Transfers Overall transfer level: Needs assistance Equipment used: 1 person hand held assist Transfers: Sit to/from Stand Sit to Stand: Min guard         General transfer comment: Min guard assist for balance. Pt did not want to wear brace.    Ambulation/Gait Ambulation/Gait assistance: Mod assist Ambulation Distance (Feet): 75 Feet Assistive device: 1 person hand held assist (IV pole) Gait Pattern/deviations: Step-through pattern;Staggering left;Staggering right Gait velocity: decreased Gait velocity interpretation: <1.8 ft/sec, indicative of risk for recurrent falls General Gait Details: Pt with up to mod assist for LOB during gait.  Pt reports dizziness and at one point PT noticed nystagmus.  Possible vestibular issues adding to balance deficits.  Advised to find a target, limit head turns and when turning, do so slowly (I did not formally go over look , turn head, then turn body strategy yet).           Balance Overall balance assessment: Needs assistance Sitting-balance support: Feet supported;No upper extremity supported Sitting balance-Leahy Scale: Good     Standing balance support: Single extremity supported Standing balance-Leahy Scale: Fair                               Pertinent Vitals/Pain Pain Assessment: Faces Faces Pain Scale: Hurts even more Pain Location: left arm and back  Pain Descriptors / Indicators:  Grimacing;Guarding Pain Intervention(s): Limited activity within patient's tolerance;Monitored during session;Premedicated before session;Repositioned    Home Living Family/patient expects to be discharged to:: Private residence Living Arrangements: Spouse/significant other Available Help at Discharge: Family;Available  PRN/intermittently Type of Home: House Home Access: Stairs to enter Entrance Stairs-Rails: Right Entrance Stairs-Number of Steps: 3 Home Layout: One level Home Equipment: None      Prior Function Level of Independence: Independent         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     Hand Dominance   Dominant Hand: Right    Extremity/Trunk Assessment   Upper Extremity Assessment: Defer to OT evaluation           Lower Extremity Assessment: Generalized weakness      Cervical / Trunk Assessment: Other exceptions  Communication   Communication: No difficulties  Cognition Arousal/Alertness: Awake/alert Behavior During Therapy: WFL for tasks assessed/performed Overall Cognitive Status: No family/caregiver present to determine baseline cognitive functioning                      General Comments General comments (skin integrity, edema, etc.): Had pt do smooth persuits in sitting and noticed with right gaze right beating nystagmus.            Assessment/Plan    PT Assessment Patient needs continued PT services  PT Diagnosis Difficulty walking;Abnormality of gait;Generalized weakness   PT Problem List Decreased strength;Decreased activity tolerance;Decreased balance;Decreased mobility;Decreased knowledge of use of DME;Pain  PT Treatment Interventions DME instruction;Gait training;Stair training;Functional mobility training;Therapeutic activities;Therapeutic exercise;Balance training;Neuromuscular re-education;Patient/family education   PT Goals (Current goals can be found in the Care Plan section) Acute Rehab PT Goals Patient Stated Goal: to be able to recover as quickly as her husband did when he fell off the roof PT Goal Formulation: With patient Time For Goal Achievement: 06/24/16 Potential to Achieve Goals: Good    Frequency Min 5X/week   Barriers to discharge Decreased caregiver support husband works       End of Session   Activity  Tolerance: Patient limited by fatigue;Patient limited by pain Patient left: in bed;with call bell/phone within reach;with bed alarm set Nurse Communication: Mobility status         Time: 1722-1747 PT Time Calculation (min) (ACUTE ONLY): 25 min   Charges:   PT Evaluation $PT Eval Low Complexity: 1 Procedure PT Treatments $Gait Training: 8-22 mins        Darnice Comrie B. Clotilda Hafer, PT, DPT [phone]   06/17/2016, 6:06 PM

## 2016-06-17 NOTE — Progress Notes (Signed)
*  PRELIMINARY RESULTS* Echocardiogram 2D Echocardiogram has been performed.  Jeryl Columbialliott, Daphyne Miguez 06/17/2016, 4:01 PM

## 2016-06-17 NOTE — Progress Notes (Signed)
Subjective: 2 Days Post-Op Procedure(s) (LRB): OPEN REDUCTION INTERNAL FIXATION (ORIF) DISTAL RADIAL FRACTUREAND ULNA (Left) Patient sleeping upon entering the room this morning. She states she feels somewhat better than yesterday. She states that the hand feels less swollen however, she still having a fair amount of tenderness. Her catheter was removed earlier this morning. He complains of a headache at this point in time. She denies nausea, vomiting, fever or chills.  Objective: Vital signs in last 24 hours: Temp:  [98 F (36.7 C)-100 F (37.8 C)] 98 F (36.7 C) (07/20 0347) Pulse Rate:  [99-123] 99 (07/20 0350) Resp:  [14-27] 18 (07/20 0350) BP: (93-155)/(57-98) 104/68 mmHg (07/20 0350) SpO2:  [91 %-100 %] 100 % (07/20 0350)  Intake/Output from previous day: 07/19 0701 - 07/20 0700 In: 2100 [I.V.:2000; IV Piggyback:100] Out: 5300 [Urine:5300] Intake/Output this shift:     Recent Labs  06/15/16 0855 06/15/16 0918 06/16/16 0753  HGB 12.0 12.2 9.7*    Recent Labs  06/15/16 0855 06/15/16 0918 06/16/16 0753  WBC 9.9  --  10.5  RBC 4.08  --  3.36*  HCT 35.7* 36.0 28.8*  PLT 345  --  231    Recent Labs  06/15/16 0855 06/15/16 0918 06/16/16 0753  NA 139 140 135  K 4.3 4.2 4.0  CL 106 104 101  CO2 25  --  25  BUN 20 25* 9  CREATININE 0.87 0.90 0.74  GLUCOSE 122* 123* 136*  CALCIUM 9.5  --  8.5*    Recent Labs  06/15/16 0855  INR 1.01    Physical examination:  She is pleasant this morning and she is somewhat pain focused in regards to the left upper extremity. Examination left upper extremity shows that her edema is overall improving, gross sensation is intact, refill is intact to the digits. Still has mild to moderate edema of the digits and dorsal aspect of the hand and I have performed massage techniques to the hand and have him and strata to her attempts at active range of motion, place and hold as well as passive range of motion much to her dismay  however, certainly made some gains this morning and I have discussed with her she must continue this throughout the day each hour The patient is alert and oriented in no acute distress. The patient complains of pain in the affected upper extremity.  The patient is noted to have a normal HEENT exam. Lung fields show equal chest expansion and no shortness of breath. Abdomen exam is nontender without distention. Lower extremity examination does not show any fracture dislocation or blood clot symptoms. Pelvis is stable and the neck and back are stable and nontender.  Assessment/Plan: 2 Days Post-Op Procedure(s) (LRB): OPEN REDUCTION INTERNAL FIXATION (ORIF) DISTAL RADIAL FRACTUREAND ULNA (Left) We will plan for continued elevation, edema control and diligent range of motion attempts both active and passive each hour to the fingers as well as massage. She will see therapy today for ambulation and range of motion. He will wear her TLSO brace for comfort purposes are neurosurgeries instruction. We will recommend minimizing pain medications, given prior events noted. Continue close observatory care. She will need to get out of bed and into a chair today and in addition make attempts at ambulation and I'll and frequent attempts at range of motion of the digits as instructed. Plan on dressing and splint changed tomorrow to check her wounds. All questions were encouraged and answered.  Kyliegh Jester L 06/17/2016, 8:10 AM

## 2016-06-17 NOTE — Progress Notes (Signed)
Patient ID: Angela Vaughan, female   DOB: [birth year], 50 y.o.   MRN: [id number]  PROGRESS NOTE    Angela Vaughan  WUJ:[id number] DOB: 10-04-1966 DOA: 06/15/2016  PCP: Default, Provider, MD   Brief Narrative:  50 y.o. female with history of anxiety who fell from a roof early yesterday morning and sustained a displaced distal radial fracture / distal ulnar fracture and a T2 compression fracture . Patient underwent a repair of LUE fractures around 1am. Around 8 am patient was found with an empty home pill bottle of Oxycodone in her bed. Patient was lethargic and hypoxic with sats in the 30s. Code blue was activated around 1:30pm when patient was found to be unresponsive in PEA. CPR was started. Of note, patient had oxycodone at 12:20 pm. After a dose of Narcan and IV epinephrine patient regained consciousness. She was placed on a non-rebreather.   Assessment & Plan:   Active Problems:   S/P ORIF (open reduction internal fixation) fracture / Open left forearm fracture - Per ortho    PEA (Pulseless electrical activity) (HCC) /  Acute on chronic respiratory failure with hypoxia (HCC) - Code blue activated at the time of pt arrest. With epi and narcan she regained consciousness - She is stable in regards to cardiovascular and respiratory status. Blood pressure 124/62 and heart rate 105    Mild troponin elevation - Demand ischemia from PEA - No reports of chest pain this morning    Acute blood loss anemia - Likely postoperative. Hemoglobin 12.2 on 06/15/2016 and then 9.7 the following day. - Please repeat CBC today, order placed   DVT prophylaxis: SCD's bilaterally  Code Status: full code  Family Communication: husband at the bedside this am Disposition Plan: per ortho   TRH to sign off, please call for any questions or concerns.  Manson Passey [facility] [phone]   Consultants:   TRH  Procedures:   OPEN REDUCTION INTERNAL FIXATION (ORIF) DISTAL RADIAL FRACTUREAND ULNA (Left)  06/15/2016  Antimicrobials:   Cefazolin and gentamicin   Subjective: Feels better this am.   Objective: Filed Vitals:   06/16/16 2321 06/17/16 0347 06/17/16 0350 06/17/16 0821  BP: 93/57  104/68 124/62  Pulse: 105  99 105  Temp: 100 F (37.8 C) 98 F (36.7 C)  98.1 F (36.7 C)  TempSrc: Oral Oral  Oral  Resp:   18 16  SpO2: 96%  100% 92%    Intake/Output Summary (Last 24 hours) at 06/17/16 0848 Last data filed at 06/17/16 0352  Gross per 24 hour  Intake   2100 ml  Output   4300 ml  Net  -2200 ml   There were no vitals filed for this visit.  Examination:  General exam: Appears calm and comfortable  Respiratory system: Clear to auscultation. Respiratory effort normal. Cardiovascular system: S1 & S2 heard, RRR.  Gastrointestinal system: Abdomen is nondistended, soft and nontender. No organomegaly or masses felt. Normal bowel sounds heard. Central nervous system: No focal neurological deficits. Extremities: Symmetric 5 x 5 power. Skin: No rashes, lesions or ulcers Psychiatry: Judgement and insight appear normal. Mood & affect appropriate.   Data Reviewed: I have personally reviewed following labs and imaging studies  CBC:  Recent Labs Lab 06/15/16 0855 06/15/16 0918 06/16/16 0753  WBC 9.9  --  10.5  NEUTROABS  --   --  8.1*  HGB 12.0 12.2 9.7*  HCT 35.7* 36.0 28.8*  MCV 87.5  --  85.7  PLT 345  --  231  Basic Metabolic Panel:  Recent Labs Lab 06/15/16 0855 06/15/16 0918 06/16/16 0753  NA 139 140 135  K 4.3 4.2 4.0  CL 106 104 101  CO2 25  --  25  GLUCOSE 122* 123* 136*  BUN 20 25* 9  CREATININE 0.87 0.90 0.74  CALCIUM 9.5  --  8.5*   GFR: CrCl cannot be calculated (Unknown ideal weight.). Liver Function Tests:  Recent Labs Lab 06/15/16 0855  AST 27  ALT 18  ALKPHOS 48  BILITOT 0.3  PROT 6.4*  ALBUMIN 3.6   No results for input(s): LIPASE, AMYLASE in the last 168 hours. No results for input(s): AMMONIA in the last 168  hours. Coagulation Profile:  Recent Labs Lab 06/15/16 0855  INR 1.01   Cardiac Enzymes:  Recent Labs Lab 06/16/16 1807 06/16/16 2239 06/17/16 0403  TROPONINI 0.15* 0.17* 0.10*   BNP (last 3 results) No results for input(s): PROBNP in the last 8760 hours. HbA1C: No results for input(s): HGBA1C in the last 72 hours. CBG: No results for input(s): GLUCAP in the last 168 hours. Lipid Profile: No results for input(s): CHOL, HDL, LDLCALC, TRIG, CHOLHDL, LDLDIRECT in the last 72 hours. Thyroid Function Tests: No results for input(s): TSH, T4TOTAL, FREET4, T3FREE, THYROIDAB in the last 72 hours. Anemia Panel: No results for input(s): VITAMINB12, FOLATE, FERRITIN, TIBC, IRON, RETICCTPCT in the last 72 hours. Urine analysis:    Component Value Date/Time   COLORURINE YELLOW 06/15/2016 1254   APPEARANCEUR CLEAR 06/15/2016 1254   LABSPEC <1.005* 06/15/2016 1254   PHURINE 5.0 06/15/2016 1254   GLUCOSEU NEGATIVE 06/15/2016 1254   HGBUR LARGE* 06/15/2016 1254   BILIRUBINUR NEGATIVE 06/15/2016 1254   KETONESUR NEGATIVE 06/15/2016 1254   PROTEINUR NEGATIVE 06/15/2016 1254   UROBILINOGEN 0.2 07/09/2013 1050   NITRITE NEGATIVE 06/15/2016 1254   LEUKOCYTESUR NEGATIVE 06/15/2016 1254   Sepsis Labs: @LABRCNTIP (procalcitonin:4,lacticidven:4)   Recent Results (from the past 240 hour(s))  MRSA PCR Screening     Status: None   Collection Time: 06/16/16  8:30 PM  Result Value Ref Range Status   MRSA by PCR NEGATIVE NEGATIVE Final      Radiology Studies: Dg Forearm Left  06/15/2016  CLINICAL DATA:  Fall from roof this morning with obvious pain and deformity left wrist. EXAM: LEFT FOREARM - 2 VIEW COMPARISON:  None. FINDINGS: There is a significantly displaced transverse fracture of the distal radial metaphysis with 1 shaft's with of radial displacement and 2 shaft's with dorsal displacement of the distal fragment. Mild dorsal angulation of the distal fragment. Displaced transverse  fracture of the distal ulnar diaphysis with 1 shaft's with of radial and dorsal displacement of the distal fragment as well as dorsal angulation of the distal fragment. Displaced ulnar styloid fracture. IMPRESSION: Significantly displaced distal radial metaphyseal fracture and distal ulnar diaphyseal fractures as described. Ulnar styloid fracture. Electronically Signed   By: Elberta Fortis M.D.   On: 06/15/2016 09:54   Dg Wrist 2 Views Left  06/15/2016  CLINICAL DATA:  Fall from roof this morning with pain and deformity left wrist. EXAM: LEFT WRIST - 2 VIEW COMPARISON:  03/24/2011 FINDINGS: There is transverse fracture of the distal radial metaphysis with significant displacement. One shaft's with of radial displacement with 2 shaft's with of posterior displacement of the distal fragment. There is a displaced transverse fracture of the distal diaphysis of the ulna with approximately 1 shaft's with of radial and dorsal displacement of the distal fragment. There is dorsal angulation both the distal  radial and ulnar fragments. Displaced ulnar styloid fracture. IMPRESSION: Significantly displaced distal radial metaphyseal fracture as well as displaced distal ulnar diaphyseal fracture as described. Displaced ulnar styloid fracture. Electronically Signed   By: Elberta Fortisaniel  Boyle M.D.   On: 06/15/2016 09:52   Ct Head Wo Contrast  06/15/2016  CLINICAL DATA:  Fall from roof this morning. EXAM: CT HEAD WITHOUT CONTRAST CT CERVICAL SPINE WITHOUT CONTRAST TECHNIQUE: Multidetector CT imaging of the head and cervical spine was performed following the standard protocol without intravenous contrast. Multiplanar CT image reconstructions of the cervical spine were also generated. COMPARISON:  None. FINDINGS: CT HEAD FINDINGS Ventricles, cisterns and other CSF spaces are within normal. There is no mass, mass effect, shift of midline structures or acute hemorrhage. No evidence of acute infarction. Subtle opacification over the ethmoid  sinus. Possible subtle fracture of the nasal bone which may be acute or chronic. Deviation of the nasal septum to the left. CT CERVICAL SPINE FINDINGS Vertebral body alignment, heights and disc space heights are within normal. Very minimal spondylosis of the mid to lower cervical spine. Prevertebral soft tissues are normal. The atlantoaxial articulation is normal. Minimal uncovertebral joint spurring. No acute fracture or subluxation. Remainder of the exam is within normal. IMPRESSION: No acute intracranial findings. Possible subtle fracture of the nasal bone which may be acute or chronic. Opacification in the ethmoid sinus. No acute cervical spine injury. Subtle spondylosis of the mid to lower cervical spine. Electronically Signed   By: Elberta Fortisaniel  Boyle M.D.   On: 06/15/2016 10:44   Ct Chest W Contrast  06/15/2016  CLINICAL DATA:  Fall from roof.  Initial encounter. EXAM: CT CHEST, ABDOMEN AND PELVIS WITHOUT CONTRAST TECHNIQUE: Multidetector CT imaging of the chest, abdomen and pelvis was performed following the standard protocol without IV contrast. COMPARISON:  None. FINDINGS: CT CHEST FINDINGS Mediastinum/Lymph Nodes: There is no axillary lymphadenopathy. No mediastinal lymphadenopathy. There is no hilar lymphadenopathy. No evidence for mediastinal hemorrhage. Although not dedicated CTA exam, no irregular wall thickening in the thoracic aorta. No evidence for dissection of the thoracic aorta. The heart size is normal. No pericardial effusion. The esophagus has normal imaging features. Lungs/Pleura: No evidence for pneumothorax. No focal lung contusion. No evidence for posttraumatic spermatocele. No pleural effusion. 5 mm right middle lobe pulmonary nodule is seen on image 63 series 3. 4 mm triangular-shaped perifissural nodule seen on image 61 series 3 is probably a subpleural lymph node. Musculoskeletal: No evidence for rib fracture. T12 superior endplate compression fracture has acute appearance by CT.  Fracture results in less than 25% loss of height anteriorly and posteriorly with central endplate depression. 3-4 mm posterior bony retropulsion of posterior cortex identified in the upper T12 vertebral body. CT ABDOMEN PELVIS FINDINGS Hepatobiliary: No focal abnormality within the liver parenchyma. There is no evidence for gallstones, gallbladder wall thickening, or pericholecystic fluid. Common bile duct measures 7-8 mm diameter in the pancreatic head. Pancreas: No focal mass lesion. No dilatation of the main duct. No intraparenchymal cyst. No peripancreatic edema. Spleen: No splenomegaly. No focal mass lesion. Adrenals/Urinary Tract: No adrenal nodule or mass. Kidneys show symmetric perfusion and excretion. No perinephric edema or fluid collection. No evidence for hydroureter. The urinary bladder appears normal for the degree of distention. Stomach/Bowel: Stomach is nondistended. No gastric wall thickening. No evidence of outlet obstruction. Duodenum is normally positioned as is the ligament of Treitz. No small bowel wall thickening. No small bowel dilatation. The terminal ileum is normal. No gross colonic mass.  No colonic wall thickening. No substantial diverticular change. Vascular/Lymphatic: No abdominal aortic aneurysm. No abdominal aortic atherosclerotic calcification. There is no gastrohepatic or hepatoduodenal ligament lymphadenopathy. No intraperitoneal or retroperitoneal lymphadenopathy. No pelvic sidewall lymphadenopathy. Reproductive: Endometrial stripe is somewhat prominent which may be within normal limits for age. There is no adnexal mass. Other: No intraperitoneal free fluid. Musculoskeletal: T12 compression fracture is mentioned above. Otherwise no lumbar spine or bony pelvic fracture. IMPRESSION: 1. T12 compression fracture as described. 2. No evidence for non bony acute traumatic organ injury in the chest, abdomen, or pelvis. No intraperitoneal free fluid. 3. Mild dilatation of the common bile  duct without identifiable a etiology. No associated dilatation of the main pancreatic duct. Correlation with liver function test may prove helpful. 4. 5 mm right middle lobe pulmonary nodule No follow-up needed if patient is low-risk. Non-contrast chest CT can be considered in 12 months if patient is high-risk. This recommendation follows the consensus statement: Guidelines for Management of Incidental Pulmonary Nodules Detected on CT Images:From the Fleischner Society 2017; published online before print (10.1148/[id number]). Electronically Signed   By: Kennith Center M.D.   On: 06/15/2016 11:08   Ct Cervical Spine Wo Contrast  06/15/2016  CLINICAL DATA:  Fall from roof this morning. EXAM: CT HEAD WITHOUT CONTRAST CT CERVICAL SPINE WITHOUT CONTRAST TECHNIQUE: Multidetector CT imaging of the head and cervical spine was performed following the standard protocol without intravenous contrast. Multiplanar CT image reconstructions of the cervical spine were also generated. COMPARISON:  None. FINDINGS: CT HEAD FINDINGS Ventricles, cisterns and other CSF spaces are within normal. There is no mass, mass effect, shift of midline structures or acute hemorrhage. No evidence of acute infarction. Subtle opacification over the ethmoid sinus. Possible subtle fracture of the nasal bone which may be acute or chronic. Deviation of the nasal septum to the left. CT CERVICAL SPINE FINDINGS Vertebral body alignment, heights and disc space heights are within normal. Very minimal spondylosis of the mid to lower cervical spine. Prevertebral soft tissues are normal. The atlantoaxial articulation is normal. Minimal uncovertebral joint spurring. No acute fracture or subluxation. Remainder of the exam is within normal. IMPRESSION: No acute intracranial findings. Possible subtle fracture of the nasal bone which may be acute or chronic. Opacification in the ethmoid sinus. No acute cervical spine injury. Subtle spondylosis of the mid to  lower cervical spine. Electronically Signed   By: Elberta Fortis M.D.   On: 06/15/2016 10:44   Ct Abdomen Pelvis W Contrast  06/15/2016  CLINICAL DATA:  Fall from roof.  Initial encounter. EXAM: CT CHEST, ABDOMEN AND PELVIS WITHOUT CONTRAST TECHNIQUE: Multidetector CT imaging of the chest, abdomen and pelvis was performed following the standard protocol without IV contrast. COMPARISON:  None. FINDINGS: CT CHEST FINDINGS Mediastinum/Lymph Nodes: There is no axillary lymphadenopathy. No mediastinal lymphadenopathy. There is no hilar lymphadenopathy. No evidence for mediastinal hemorrhage. Although not dedicated CTA exam, no irregular wall thickening in the thoracic aorta. No evidence for dissection of the thoracic aorta. The heart size is normal. No pericardial effusion. The esophagus has normal imaging features. Lungs/Pleura: No evidence for pneumothorax. No focal lung contusion. No evidence for posttraumatic spermatocele. No pleural effusion. 5 mm right middle lobe pulmonary nodule is seen on image 63 series 3. 4 mm triangular-shaped perifissural nodule seen on image 61 series 3 is probably a subpleural lymph node. Musculoskeletal: No evidence for rib fracture. T12 superior endplate compression fracture has acute appearance by CT. Fracture results in less than 25% loss of  height anteriorly and posteriorly with central endplate depression. 3-4 mm posterior bony retropulsion of posterior cortex identified in the upper T12 vertebral body. CT ABDOMEN PELVIS FINDINGS Hepatobiliary: No focal abnormality within the liver parenchyma. There is no evidence for gallstones, gallbladder wall thickening, or pericholecystic fluid. Common bile duct measures 7-8 mm diameter in the pancreatic head. Pancreas: No focal mass lesion. No dilatation of the main duct. No intraparenchymal cyst. No peripancreatic edema. Spleen: No splenomegaly. No focal mass lesion. Adrenals/Urinary Tract: No adrenal nodule or mass. Kidneys show symmetric  perfusion and excretion. No perinephric edema or fluid collection. No evidence for hydroureter. The urinary bladder appears normal for the degree of distention. Stomach/Bowel: Stomach is nondistended. No gastric wall thickening. No evidence of outlet obstruction. Duodenum is normally positioned as is the ligament of Treitz. No small bowel wall thickening. No small bowel dilatation. The terminal ileum is normal. No gross colonic mass. No colonic wall thickening. No substantial diverticular change. Vascular/Lymphatic: No abdominal aortic aneurysm. No abdominal aortic atherosclerotic calcification. There is no gastrohepatic or hepatoduodenal ligament lymphadenopathy. No intraperitoneal or retroperitoneal lymphadenopathy. No pelvic sidewall lymphadenopathy. Reproductive: Endometrial stripe is somewhat prominent which may be within normal limits for age. There is no adnexal mass. Other: No intraperitoneal free fluid. Musculoskeletal: T12 compression fracture is mentioned above. Otherwise no lumbar spine or bony pelvic fracture. IMPRESSION: 1. T12 compression fracture as described. 2. No evidence for non bony acute traumatic organ injury in the chest, abdomen, or pelvis. No intraperitoneal free fluid. 3. Mild dilatation of the common bile duct without identifiable a etiology. No associated dilatation of the main pancreatic duct. Correlation with liver function test may prove helpful. 4. 5 mm right middle lobe pulmonary nodule No follow-up needed if patient is low-risk. Non-contrast chest CT can be considered in 12 months if patient is high-risk. This recommendation follows the consensus statement: Guidelines for Management of Incidental Pulmonary Nodules Detected on CT Images:From the Fleischner Society 2017; published online before print (10.1148/[id number]). Electronically Signed   By: Kennith Center M.D.   On: 06/15/2016 11:08   Dg Chest Port 1 View  06/16/2016  CLINICAL DATA:  Status post cardiac arrest this  morning. EXAM: PORTABLE CHEST 1 VIEW COMPARISON:  04/20/2010. FINDINGS: Interval enlargement of the cardiac silhouette, magnified by a poor inspiration and portable AP technique. Clear lungs with normal vasculature. Possible small nodule in the right upper lung zone, laterally. Unremarkable bones. IMPRESSION: 1. Interval mild cardiomegaly. 2. Possible small right upper lung zone nodule. Follow-up PA and lateral chest radiographs are recommended when possible. Electronically Signed   By: Beckie Salts M.D.   On: 06/16/2016 17:27      Scheduled Meds: .  ceFAZolin (ANCEF) IV  1 g Intravenous Q8H  . docusate sodium  100 mg Oral BID  . gentamicin  500 mg Intravenous Q24H  . vitamin C  1,000 mg Oral Daily   Continuous Infusions: . lactated ringers 100 mL/hr at 06/16/16 1540     LOS: 2 days    Time spent: 15 minutes  Greater than 50% of the time spent on counseling and coordinating the care.   Manson Passey, MD [facility] Pager [phone]  If 7PM-7AM, please contact night-coverage [email or web address] Password [facility] 06/17/2016, 8:48 AM

## 2016-06-17 NOTE — Progress Notes (Signed)
At 1920 Pt. States "my ears are popping" and "I think there's blood in here."  Pt. Requested a q-tip to attempt to clean her ear.  Educated patient that she cannot put anything in her ear at this time even for cleaning, explained the harm that could be caused, pt. Verbalized understanding at this time.  Upon entering the patients room with the night nurse for handoff at 1950 the patient appeared to be pushing something in her ear.  Patient asked what she had in her ear and she removed a pair of tweezers.  Patients husband states "you said you was pulling a hair."  Educated the patient on the risk of placing any object in her ear, pt. Verbalized understanding.  Night nurse will continue to monitor.

## 2016-06-18 DIAGNOSIS — Z8781 Personal history of (healed) traumatic fracture: Secondary | ICD-10-CM

## 2016-06-18 DIAGNOSIS — Z967 Presence of other bone and tendon implants: Secondary | ICD-10-CM

## 2016-06-18 LAB — CBC
HCT: 26.2 % — ABNORMAL LOW (ref 36.0–46.0)
HEMOGLOBIN: 8.4 g/dL — AB (ref 12.0–15.0)
MCH: 28.2 pg (ref 26.0–34.0)
MCHC: 32.1 g/dL (ref 30.0–36.0)
MCV: 87.9 fL (ref 78.0–100.0)
PLATELETS: 234 10*3/uL (ref 150–400)
RBC: 2.98 MIL/uL — ABNORMAL LOW (ref 3.87–5.11)
RDW: 12.6 % (ref 11.5–15.5)
WBC: 5.2 10*3/uL (ref 4.0–10.5)

## 2016-06-18 MED ORDER — CIPROFLOXACIN HCL 500 MG PO TABS: 500.0000 mg | ORAL_TABLET | Freq: Two times a day (BID) | ORAL | Status: AC

## 2016-06-18 MED ORDER — METHOCARBAMOL 500 MG PO TABS: 500.0000 mg | ORAL_TABLET | Freq: Four times a day (QID) | ORAL | Status: AC | PRN

## 2016-06-18 MED ORDER — OXYCODONE HCL 5 MG PO TABS: 5.0000 mg | ORAL_TABLET | ORAL | Status: AC | PRN

## 2016-06-18 NOTE — Care Management Note (Signed)
Case Management Note  Patient Details  Name: Angela Vaughan MRN: [id number] Date of Birth: 1966-10-13  Subjective/Objective:  Patient is for dc today, fx of ulna and radius does not qualify for hhpt without insurance, unless willing to pay privately.  Patient  States she can afford her meds at [facility] cipro $$4, robaxin 19.48, oxycodone is 17.87. She has transportation at [employer] [employer].  No other needs.                   Action/Plan:   Expected Discharge Date:                  Expected Discharge Plan:  Home/Self Care  In-House Referral:     Discharge planning Services  CM Consult  Post Acute Care Choice:    Choice offered to:     DME Arranged:    DME Agency:     HH Arranged:    HH Agency:     Status of Service:  Completed, signed off  If discussed at [employer] Length [employer] Stay Meetings, dates discussed:    Additional Comments:  Leone Havenaylor, Culver Feighner [place], RN 06/18/2016, 12:12 PM

## 2016-06-18 NOTE — Progress Notes (Signed)
Subjective: 3 Days Post-Op Procedure(s) (LRB): OPEN REDUCTION INTERNAL FIXATION (ORIF) DISTAL RADIAL FRACTUREAND ULNA (Left) Patient reports pain as improved overall in regards to the left upper extremity. She still having intermittent pain with her back but states that the TLSO brace does seem to help. She queries whether she can go home today. She states she would like to go if possible. She states that her son's girlfriend can provide some degree of help at home. She denies numbness or tingling of the left upper extremity. She denies any dizziness, lightheadedness, shortness of breath, significant fatigue. I have discussed with her her acute blood loss and occasions for transfusion however she would like to forego this she states she's not overly symptomatic and does not want a transfusion.  Objective: Vital signs in last 24 hours: Temp:  [97.8 F (36.6 C)-99 F (37.2 C)] 97.8 F (36.6 C) (07/21 0409) Pulse Rate:  [85-114] 85 (07/21 0700) Resp:  [12-23] 13 (07/21 0700) BP: (105-139)/(62-82) 118/81 mmHg (07/21 0409) SpO2:  [92 %-100 %] 97 % (07/21 0700) Weight:  [95.6 kg (210 lb 12.2 oz)] 95.6 kg (210 lb 12.2 oz) (07/20 2348)  Intake/Output from previous day: 07/20 0701 - 07/21 0700 In: 1300 [I.V.:1300] Out: -  Intake/Output this shift:     Recent Labs  06/15/16 0855 06/15/16 0918 06/16/16 0753 06/17/16 1136 06/18/16 0410  HGB 12.0 12.2 9.7* 8.7* 8.4*    Recent Labs  06/17/16 1136 06/18/16 0410  WBC 8.3 5.2  RBC 3.03* 2.98*  HCT 26.1* 26.2*  PLT 235 234    Recent Labs  06/15/16 0855 06/15/16 0918 06/16/16 0753  NA 139 140 135  K 4.3 4.2 4.0  CL 106 104 101  CO2 25  --  25  BUN 20 25* 9  CREATININE 0.87 0.90 0.74  GLUCOSE 122* 123* 136*  CALCIUM 9.5  --  8.5*    Recent Labs  06/15/16 0855  INR 1.01    The patient is alert and oriented in no acute distress. The patient complains of pain in the affected upper extremity.  The patient is noted to have a  normal HEENT exam. Lung fields show equal chest expansion and no shortness of breath. Abdomen exam is nontender without distention. Lower extremity examination does not show any fracture dislocation or blood clot symptoms. Pelvis is stable and the neck  stable and nontender. Left upper extremity shows that she has improved edema overall neurovascularly she is intact. She has a better range of motion this morning regards to active range of motion and does tolerate passive range of motion slightly better. Assessment/Plan: 3 Days Post-Op Procedure(s) (LRB): OPEN REDUCTION INTERNAL FIXATION (ORIF) DISTAL RADIAL FRACTUREAND ULNA (Left) Patient Active Problem List   Diagnosis Date Noted  . PEA (Pulseless electrical activity) (HCC)   . Unresponsive   . Tachycardia   . Acute on chronic respiratory failure with hypoxia (HCC)   . S/P ORIF (open reduction internal fixation) fracture 06/15/2016  . Open left forearm fracture 06/15/2016   we have discussed with her discharge to home today after her Dose of antibiotics. We discussed with her elevation, edema control and range of motion. We will have her see us in our office setting Monday at 9:45 AM to remove her dressings and perform a wound check. She will follow up with neurosurgery per their instructions. Discussed all issues with her at length. She will take an iron supplement over-the-counter. All questions were encouraged and answered.  Draven Natter L 06/18/2016, 8:06 AM

## 2016-06-18 NOTE — Progress Notes (Signed)
Patient to be discharged today after 2pm dose of IV antibiotics. Instructions and scripts ready for discharge, patient is aware of time frame and is making arrangements for pick up based on anticipated time of discharge.

## 2016-06-18 NOTE — Discharge Summary (Signed)
Physician Discharge Summary  Patient ID: Angela Vaughan MRN: [id number] DOB/AGE: 08-20-1966 50 y.o.  Admit date: 06/15/2016 Discharge date:   Admission Diagnoses: Left Distal Radius and Ulna Fx Past Medical History  Diagnosis Date  . Hypertension     Discharge Diagnoses:  Active Problems:   S/P ORIF (open reduction internal fixation) fracture   Open left forearm fracture   PEA (Pulseless electrical activity) (HCC)   Unresponsive   Tachycardia   Acute on chronic respiratory failure with hypoxia (HCC)   Surgeries: Procedure(s): OPEN REDUCTION INTERNAL FIXATION (ORIF) DISTAL RADIAL FRACTUREAND ULNA on 06/15/2016    Consultants: Treatment Team:  Carollee Sires, MD  Discharged Condition: Improved  Hospital Course: Angela Vaughan is an 50 y.o. female who was admitted 06/15/2016 with a chief complaint of Chief Complaint  Patient presents with  . Fall  . Arm Injury  , and found to have a diagnosis of Left Distal Radius and Ulna Fx.  They were brought to the operating room on 06/15/2016 and underwent Procedure(s): OPEN REDUCTION INTERNAL FIXATION (ORIF) DISTAL RADIAL FRACTUREAND ULNA.    They were given perioperative antibiotics: Anti-infectives    Start     Dose/Rate Route Frequency Ordered Stop   06/18/16 0000  ciprofloxacin (CIPRO) 500 MG tablet     500 mg Oral 2 times daily 06/18/16 0804     06/16/16 0500  gentamicin (GARAMYCIN) 500 mg in dextrose 5 % 100 mL IVPB     500 mg 112.5 mL/hr over 60 Minutes Intravenous Every 24 hours 06/15/16 2349     06/16/16 0345  ceFAZolin (ANCEF) IVPB 1 g/50 mL premix     1 g 100 mL/hr over 30 Minutes Intravenous Every 8 hours 06/15/16 2335     06/15/16 2345  ceFAZolin (ANCEF) IVPB 1 g/50 mL premix     1 g 100 mL/hr over 30 Minutes Intravenous NOW 06/15/16 2335 06/16/16 0130   06/15/16 2000  penicillin G potassium 4 Million Units in dextrose 5 % 250 mL IVPB  Status:  Discontinued     4 Million Units 250 mL/hr over 60 Minutes Intravenous Every  4 hours 06/15/16 1951 06/16/16 1405   06/15/16 1629  gentamicin (GARAMYCIN) 1.6-0.9 MG/ML-% IVPB    Comments:  Marrianne Mood   : cabinet override      06/15/16 1629 06/15/16 1739   06/15/16 1629  ceFAZolin (ANCEF) 2-4 GM/100ML-% IVPB    Comments:  Marrianne Mood   : cabinet override      06/15/16 1629 06/15/16 1655   06/15/16 0915  ceFAZolin (ANCEF) IVPB 1 g/50 mL premix     1 g 100 mL/hr over 30 Minutes Intravenous  Once 06/15/16 0903 06/15/16 1010    .  They were given sequential compression devices, early ambulation.  Recent vital signs: Patient Vitals for the past 24 hrs:  BP Temp Temp src Pulse Resp SpO2 Height Weight  06/18/16 0700 - - - 85 13 97 % - -  06/18/16 0409 118/81 mmHg 97.8 F (36.6 C) Oral 89 12 98 % - -  06/17/16 2348 124/76 mmHg - - (!) 114 (!) 23 100 % 5\' 3"  (1.6 m) 95.6 kg (210 lb 12.2 oz)  06/17/16 2346 - 98.2 F (36.8 C) Oral - - - - -  06/17/16 2016 105/64 mmHg - - 93 14 99 % - -  06/17/16 2014 - 98.4 F (36.9 C) Oral - - - - -  06/17/16 1524 139/77 mmHg 98.1 F (36.7 C) Oral 99 20 95 % - -  06/17/16 1258 120/82 mmHg 99 F (37.2 C) Oral (!) 112 18 97 % - -  06/17/16 0821 124/62 mmHg 98.1 F (36.7 C) Oral (!) 105 16 92 % - -  .  Recent laboratory studies: Dg Chest Port 1 View  06/16/2016  CLINICAL DATA:  Status post cardiac arrest this morning. EXAM: PORTABLE CHEST 1 VIEW COMPARISON:  04/20/2010. FINDINGS: Interval enlargement of the cardiac silhouette, magnified by a poor inspiration and portable AP technique. Clear lungs with normal vasculature. Possible small nodule in the right upper lung zone, laterally. Unremarkable bones. IMPRESSION: 1. Interval mild cardiomegaly. 2. Possible small right upper lung zone nodule. Follow-up PA and lateral chest radiographs are recommended when possible. Electronically Signed   By: Beckie Salts M.D.   On: 06/16/2016 17:27    Discharge Medications:     Medication List    STOP taking these medications         diphenhydramine-acetaminophen 25-500 MG Tabs tablet  Commonly known as:  TYLENOL PM      TAKE these medications        ALPRAZolam 1 MG tablet  Commonly known as:  XANAX  Take 1 mg by mouth 3 (three) times daily as needed for anxiety.     ciprofloxacin 500 MG tablet  Commonly known as:  CIPRO  Take 1 tablet (500 mg total) by mouth 2 (two) times daily.     GOODY HEADACHE PO  Take 1 packet by mouth daily as needed (pain).     methocarbamol 500 MG tablet  Commonly known as:  ROBAXIN  Take 1 tablet (500 mg total) by mouth every 6 (six) hours as needed for muscle spasms.     oxyCODONE 5 MG immediate release tablet  Commonly known as:  Oxy IR/ROXICODONE  Take 1-2 tablets (5-10 mg total) by mouth every 3 (three) hours as needed for severe pain.        Diagnostic Studies: Dg Forearm Left  06/15/2016  CLINICAL DATA:  Fall from roof this morning with obvious pain and deformity left wrist. EXAM: LEFT FOREARM - 2 VIEW COMPARISON:  None. FINDINGS: There is a significantly displaced transverse fracture of the distal radial metaphysis with 1 shaft's with of radial displacement and 2 shaft's with dorsal displacement of the distal fragment. Mild dorsal angulation of the distal fragment. Displaced transverse fracture of the distal ulnar diaphysis with 1 shaft's with of radial and dorsal displacement of the distal fragment as well as dorsal angulation of the distal fragment. Displaced ulnar styloid fracture. IMPRESSION: Significantly displaced distal radial metaphyseal fracture and distal ulnar diaphyseal fractures as described. Ulnar styloid fracture. Electronically Signed   By: Elberta Fortis M.D.   On: 06/15/2016 09:54   Dg Wrist 2 Views Left  06/15/2016  CLINICAL DATA:  Fall from roof this morning with pain and deformity left wrist. EXAM: LEFT WRIST - 2 VIEW COMPARISON:  03/24/2011 FINDINGS: There is transverse fracture of the distal radial metaphysis with significant displacement. One shaft's with of  radial displacement with 2 shaft's with of posterior displacement of the distal fragment. There is a displaced transverse fracture of the distal diaphysis of the ulna with approximately 1 shaft's with of radial and dorsal displacement of the distal fragment. There is dorsal angulation both the distal radial and ulnar fragments. Displaced ulnar styloid fracture. IMPRESSION: Significantly displaced distal radial metaphyseal fracture as well as displaced distal ulnar diaphyseal fracture as described. Displaced ulnar styloid fracture. Electronically Signed   By: Elberta Fortis M.D.  On: 06/15/2016 09:52   Ct Head Wo Contrast  06/15/2016  CLINICAL DATA:  Fall from roof this morning. EXAM: CT HEAD WITHOUT CONTRAST CT CERVICAL SPINE WITHOUT CONTRAST TECHNIQUE: Multidetector CT imaging of the head and cervical spine was performed following the standard protocol without intravenous contrast. Multiplanar CT image reconstructions of the cervical spine were also generated. COMPARISON:  None. FINDINGS: CT HEAD FINDINGS Ventricles, cisterns and other CSF spaces are within normal. There is no mass, mass effect, shift of midline structures or acute hemorrhage. No evidence of acute infarction. Subtle opacification over the ethmoid sinus. Possible subtle fracture of the nasal bone which may be acute or chronic. Deviation of the nasal septum to the left. CT CERVICAL SPINE FINDINGS Vertebral body alignment, heights and disc space heights are within normal. Very minimal spondylosis of the mid to lower cervical spine. Prevertebral soft tissues are normal. The atlantoaxial articulation is normal. Minimal uncovertebral joint spurring. No acute fracture or subluxation. Remainder of the exam is within normal. IMPRESSION: No acute intracranial findings. Possible subtle fracture of the nasal bone which may be acute or chronic. Opacification in the ethmoid sinus. No acute cervical spine injury. Subtle spondylosis of the mid to lower cervical  spine. Electronically Signed   By: Elberta Fortis M.D.   On: 06/15/2016 10:44   Ct Chest W Contrast  06/15/2016  CLINICAL DATA:  Fall from roof.  Initial encounter. EXAM: CT CHEST, ABDOMEN AND PELVIS WITHOUT CONTRAST TECHNIQUE: Multidetector CT imaging of the chest, abdomen and pelvis was performed following the standard protocol without IV contrast. COMPARISON:  None. FINDINGS: CT CHEST FINDINGS Mediastinum/Lymph Nodes: There is no axillary lymphadenopathy. No mediastinal lymphadenopathy. There is no hilar lymphadenopathy. No evidence for mediastinal hemorrhage. Although not dedicated CTA exam, no irregular wall thickening in the thoracic aorta. No evidence for dissection of the thoracic aorta. The heart size is normal. No pericardial effusion. The esophagus has normal imaging features. Lungs/Pleura: No evidence for pneumothorax. No focal lung contusion. No evidence for posttraumatic spermatocele. No pleural effusion. 5 mm right middle lobe pulmonary nodule is seen on image 63 series 3. 4 mm triangular-shaped perifissural nodule seen on image 61 series 3 is probably a subpleural lymph node. Musculoskeletal: No evidence for rib fracture. T12 superior endplate compression fracture has acute appearance by CT. Fracture results in less than 25% loss of height anteriorly and posteriorly with central endplate depression. 3-4 mm posterior bony retropulsion of posterior cortex identified in the upper T12 vertebral body. CT ABDOMEN PELVIS FINDINGS Hepatobiliary: No focal abnormality within the liver parenchyma. There is no evidence for gallstones, gallbladder wall thickening, or pericholecystic fluid. Common bile duct measures 7-8 mm diameter in the pancreatic head. Pancreas: No focal mass lesion. No dilatation of the main duct. No intraparenchymal cyst. No peripancreatic edema. Spleen: No splenomegaly. No focal mass lesion. Adrenals/Urinary Tract: No adrenal nodule or mass. Kidneys show symmetric perfusion and excretion.  No perinephric edema or fluid collection. No evidence for hydroureter. The urinary bladder appears normal for the degree of distention. Stomach/Bowel: Stomach is nondistended. No gastric wall thickening. No evidence of outlet obstruction. Duodenum is normally positioned as is the ligament of Treitz. No small bowel wall thickening. No small bowel dilatation. The terminal ileum is normal. No gross colonic mass. No colonic wall thickening. No substantial diverticular change. Vascular/Lymphatic: No abdominal aortic aneurysm. No abdominal aortic atherosclerotic calcification. There is no gastrohepatic or hepatoduodenal ligament lymphadenopathy. No intraperitoneal or retroperitoneal lymphadenopathy. No pelvic sidewall lymphadenopathy. Reproductive: Endometrial stripe is somewhat  prominent which may be within normal limits for age. There is no adnexal mass. Other: No intraperitoneal free fluid. Musculoskeletal: T12 compression fracture is mentioned above. Otherwise no lumbar spine or bony pelvic fracture. IMPRESSION: 1. T12 compression fracture as described. 2. No evidence for non bony acute traumatic organ injury in the chest, abdomen, or pelvis. No intraperitoneal free fluid. 3. Mild dilatation of the common bile duct without identifiable a etiology. No associated dilatation of the main pancreatic duct. Correlation with liver function test may prove helpful. 4. 5 mm right middle lobe pulmonary nodule No follow-up needed if patient is low-risk. Non-contrast chest CT can be considered in 12 months if patient is high-risk. This recommendation follows the consensus statement: Guidelines for Management of Incidental Pulmonary Nodules Detected on CT Images:From the Fleischner Society 2017; published online before print (10.1148/[id number]). Electronically Signed   By: Kennith Center M.D.   On: 06/15/2016 11:08   Ct Cervical Spine Wo Contrast  06/15/2016  CLINICAL DATA:  Fall from roof this morning. EXAM: CT HEAD  WITHOUT CONTRAST CT CERVICAL SPINE WITHOUT CONTRAST TECHNIQUE: Multidetector CT imaging of the head and cervical spine was performed following the standard protocol without intravenous contrast. Multiplanar CT image reconstructions of the cervical spine were also generated. COMPARISON:  None. FINDINGS: CT HEAD FINDINGS Ventricles, cisterns and other CSF spaces are within normal. There is no mass, mass effect, shift of midline structures or acute hemorrhage. No evidence of acute infarction. Subtle opacification over the ethmoid sinus. Possible subtle fracture of the nasal bone which may be acute or chronic. Deviation of the nasal septum to the left. CT CERVICAL SPINE FINDINGS Vertebral body alignment, heights and disc space heights are within normal. Very minimal spondylosis of the mid to lower cervical spine. Prevertebral soft tissues are normal. The atlantoaxial articulation is normal. Minimal uncovertebral joint spurring. No acute fracture or subluxation. Remainder of the exam is within normal. IMPRESSION: No acute intracranial findings. Possible subtle fracture of the nasal bone which may be acute or chronic. Opacification in the ethmoid sinus. No acute cervical spine injury. Subtle spondylosis of the mid to lower cervical spine. Electronically Signed   By: Elberta Fortis M.D.   On: 06/15/2016 10:44   Ct Abdomen Pelvis W Contrast  06/15/2016  CLINICAL DATA:  Fall from roof.  Initial encounter. EXAM: CT CHEST, ABDOMEN AND PELVIS WITHOUT CONTRAST TECHNIQUE: Multidetector CT imaging of the chest, abdomen and pelvis was performed following the standard protocol without IV contrast. COMPARISON:  None. FINDINGS: CT CHEST FINDINGS Mediastinum/Lymph Nodes: There is no axillary lymphadenopathy. No mediastinal lymphadenopathy. There is no hilar lymphadenopathy. No evidence for mediastinal hemorrhage. Although not dedicated CTA exam, no irregular wall thickening in the thoracic aorta. No evidence for dissection of the  thoracic aorta. The heart size is normal. No pericardial effusion. The esophagus has normal imaging features. Lungs/Pleura: No evidence for pneumothorax. No focal lung contusion. No evidence for posttraumatic spermatocele. No pleural effusion. 5 mm right middle lobe pulmonary nodule is seen on image 63 series 3. 4 mm triangular-shaped perifissural nodule seen on image 61 series 3 is probably a subpleural lymph node. Musculoskeletal: No evidence for rib fracture. T12 superior endplate compression fracture has acute appearance by CT. Fracture results in less than 25% loss of height anteriorly and posteriorly with central endplate depression. 3-4 mm posterior bony retropulsion of posterior cortex identified in the upper T12 vertebral body. CT ABDOMEN PELVIS FINDINGS Hepatobiliary: No focal abnormality within the liver parenchyma. There is no evidence for  gallstones, gallbladder wall thickening, or pericholecystic fluid. Common bile duct measures 7-8 mm diameter in the pancreatic head. Pancreas: No focal mass lesion. No dilatation of the main duct. No intraparenchymal cyst. No peripancreatic edema. Spleen: No splenomegaly. No focal mass lesion. Adrenals/Urinary Tract: No adrenal nodule or mass. Kidneys show symmetric perfusion and excretion. No perinephric edema or fluid collection. No evidence for hydroureter. The urinary bladder appears normal for the degree of distention. Stomach/Bowel: Stomach is nondistended. No gastric wall thickening. No evidence of outlet obstruction. Duodenum is normally positioned as is the ligament of Treitz. No small bowel wall thickening. No small bowel dilatation. The terminal ileum is normal. No gross colonic mass. No colonic wall thickening. No substantial diverticular change. Vascular/Lymphatic: No abdominal aortic aneurysm. No abdominal aortic atherosclerotic calcification. There is no gastrohepatic or hepatoduodenal ligament lymphadenopathy. No intraperitoneal or retroperitoneal  lymphadenopathy. No pelvic sidewall lymphadenopathy. Reproductive: Endometrial stripe is somewhat prominent which may be within normal limits for age. There is no adnexal mass. Other: No intraperitoneal free fluid. Musculoskeletal: T12 compression fracture is mentioned above. Otherwise no lumbar spine or bony pelvic fracture. IMPRESSION: 1. T12 compression fracture as described. 2. No evidence for non bony acute traumatic organ injury in the chest, abdomen, or pelvis. No intraperitoneal free fluid. 3. Mild dilatation of the common bile duct without identifiable a etiology. No associated dilatation of the main pancreatic duct. Correlation with liver function test may prove helpful. 4. 5 mm right middle lobe pulmonary nodule No follow-up needed if patient is low-risk. Non-contrast chest CT can be considered in 12 months if patient is high-risk. This recommendation follows the consensus statement: Guidelines for Management of Incidental Pulmonary Nodules Detected on CT Images:From the Fleischner Society 2017; published online before print (10.1148/radiol.[id number]). Electronically Signed   By: Kennith Center M.D.   On: 06/15/2016 11:08   Dg Chest Port 1 View  06/16/2016  CLINICAL DATA:  Status post cardiac arrest this morning. EXAM: PORTABLE CHEST 1 VIEW COMPARISON:  04/20/2010. FINDINGS: Interval enlargement of the cardiac silhouette, magnified by a poor inspiration and portable AP technique. Clear lungs with normal vasculature. Possible small nodule in the right upper lung zone, laterally. Unremarkable bones. IMPRESSION: 1. Interval mild cardiomegaly. 2. Possible small right upper lung zone nodule. Follow-up PA and lateral chest radiographs are recommended when possible. Electronically Signed   By: Beckie Salts M.D.   On: 06/16/2016 17:27    They benefited maximally from their hospital stay and there were no complications.     Disposition: 01-Home or Self Care     Discharge Instructions    Call MD / Call  911    Complete by:  As directed   If you experience chest pain or shortness of breath, CALL 911 and be transported to the hospital emergency room.  If you develope a fever above 101 F, pus (white drainage) or increased drainage or redness at the wound, or calf pain, call your surgeon's office.     Constipation Prevention    Complete by:  As directed   Drink plenty of fluids.  Prune juice may be helpful.  You may use a stool softener, Vaughan as Colace (over the counter) 100 mg twice a day.  Use MiraLax (over the counter) for constipation as needed.     Diet - low sodium heart healthy    Complete by:  As directed      Discharge instructions    Complete by:  As directed   Keep bandage clean and dry.  Call for any problems.  No smoking.  Criteria for driving a car: you should be off your pain medicine for 7-8 hours, able to drive one handed(confident), thinking clearly and feeling able in your judgement to drive. Continue elevation as it will decrease swelling.  If instructed by MD move your fingers within the confines of the bandage/splint.  Use ice if instructed by your MD. Call immediately for any sudden loss of feeling in your hand/arm or change in functional abilities of the extremity. We recommend that you to take vitamin C 1000 mg a day to promote healing. We also recommend that if you require  pain medicine that you take a stool softener to prevent constipation as most pain medicines will have constipation side effects. We recommend either Peri-Colace or Senokot and recommend that you also consider adding MiraLAX as well to prevent the constipation affects from pain medicine if you are required to use them. These medicines are over the counter and may be purchased at a local pharmacy. A cup of yogurt and a probiotic can also be helpful during the recovery process as the medicines can disrupt your intestinal environment.     Increase activity slowly as tolerated    Complete by:  As directed            Follow-up Information    Follow up with [facility] On 07/27/2016.   Specialty:  Internal Medicine   Why:  11 am for hospital follow up, ( [facility] full)  [facility]    Contact information:   [address] [place] [id number] [phone]      Follow up with Karen ChafeGRAMIG III,WILLIAM M, MD On 06/21/2016.   Specialty:  Orthopedic Surgery   Why:  follow up monday at 9:45am, For wound re-check   Contact information:   [address] [place] [id number] [phone]        Signed: Sheran LawlessBUCHANAN,Senica Crall L 06/18/2016, 8:05 AM

## 2016-06-18 NOTE — Discharge Instructions (Signed)

## 2016-06-18 NOTE — Progress Notes (Signed)
PT Cancellation Note  Patient Details Name: Angela Vaughan MRN: [id number] DOB: [birth year]   Cancelled Treatment:    Reason Eval/Treat Not Completed: Patient declined, no reason specified. Patient stated her husband would be here soon to take her home. She refused PT and a vestibular evaluation, stating she was fine.   [name], VirginiaPTA #[phone]  06/18/2016, 2:05 PM

## 2016-06-18 NOTE — Progress Notes (Signed)
Patient being discharged to home. Prescriptions and discharge instructions have been reviewed with the patient. She is aware of the follow up appointments and also when to call the doctor with any concerns. Patient does not have any clothing to go home so she has been provided maroon paper scrubs to be discharged in. All personal belongings are in her possession. She has no questions or concerns at this time. IV's have been removed.

## 2016-06-18 NOTE — Progress Notes (Signed)
Patient ID: Angela Vaughan, female   DOB: [birth year], 50 y.o.   MRN: [id number]  PROGRESS NOTE    Angela Vaughan  WUJ:[id number] DOB: Apr 17, 1966 DOA: 06/15/2016  PCP: Default, Provider, MD   Brief Narrative:  50 y.o. female with history of anxiety who fell from a roof early yesterday morning and sustained a displaced distal radial fracture / distal ulnar fracture and a T2 compression fracture . Patient underwent a repair of LUE fractures around 1am. Around 8 am patient was found with an empty home pill bottle of Oxycodone in her bed. Patient was lethargic and hypoxic with sats in the 30s. Code blue was activated around 1:30pm when patient was found to be unresponsive in PEA. CPR was started. Of note, patient had oxycodone at 12:20 pm. After a dose of Narcan and IV epinephrine patient regained consciousness. She was placed on a non-rebreather.   Assessment & Plan:   Active Problems:   S/P ORIF (open reduction internal fixation) fracture / Open left forearm fracture - Per ortho    PEA (Pulseless electrical activity) (HCC) /  Acute on chronic respiratory failure with hypoxia (HCC) - Code blue activated at the time of pt arrest. With epi and narcan she regained consciousness - She is stable in regards to cardiovascular and respiratory status.     Mild troponin elevation - Demand ischemia from PEA - No reports of chest pain this morning    Acute blood loss anemia - Likely postoperative. Hemoglobin 12.2 on 06/15/2016 and then 9.7 the following day. - Repeat CBC stable at 8.7, 8.4 - Ortho recommended iron supplementation, i agree with that - No reports of bleeding    Code Status: full code  Disposition Plan: per ortho   TRH to sign off, please call for any questions or concerns.  Manson Passey [facility] [phone]   Consultants:   TRH  Procedures:   OPEN REDUCTION INTERNAL FIXATION (ORIF) DISTAL RADIAL FRACTUREAND ULNA (Left) 06/15/2016  Antimicrobials:   Cefazolin and  gentamicin   Subjective: No overnight events.   Objective: Filed Vitals:   06/17/16 2348 06/18/16 0409 06/18/16 0700 06/18/16 0827  BP: 124/76 118/81  132/85  Pulse: 114 89 85 82  Temp:  97.8 F (36.6 C)  98.4 F (36.9 C)  TempSrc:  Oral  Oral  Resp: 23 12 13 12   Height: 5\' 3"  (1.6 m)     Weight: 95.6 kg (210 lb 12.2 oz)     SpO2: 100% 98% 97% 98%    Intake/Output Summary (Last 24 hours) at 06/18/16 0836 Last data filed at 06/17/16 1600  Gross per 24 hour  Intake   1300 ml  Output      0 ml  Net   1300 ml   Filed Weights   06/17/16 2348  Weight: 95.6 kg (210 lb 12.2 oz)    Examination:  General exam: No distress  Respiratory system: Respiratory effort normal. Cardiovascular system: S1 & S2 heard, Rate controlled   Gastrointestinal system: (+) BS, non tender  Central nervous system: No focal neurological deficits. Extremities: Symmetric 5 x 5 power. Skin: warm, dry  Psychiatry: Mood & affect appropriate.   Data Reviewed: I have personally reviewed following labs and imaging studies  CBC:  Recent Labs Lab 06/15/16 0855 06/15/16 0918 06/16/16 0753 06/17/16 1136 06/18/16 0410  WBC 9.9  --  10.5 8.3 5.2  NEUTROABS  --   --  8.1*  --   --   HGB 12.0 12.2 9.7* 8.7* 8.4*  HCT  35.7* 36.0 28.8* 26.1* 26.2*  MCV 87.5  --  85.7 86.1 87.9  PLT 345  --  231 235 234   Basic Metabolic Panel:  Recent Labs Lab 06/15/16 0855 06/15/16 0918 06/16/16 0753  NA 139 140 135  K 4.3 4.2 4.0  CL 106 104 101  CO2 25  --  25  GLUCOSE 122* 123* 136*  BUN 20 25* 9  CREATININE 0.87 0.90 0.74  CALCIUM 9.5  --  8.5*   GFR: Estimated Creatinine Clearance: 94.6 mL/min (by C-G formula based on Cr of 0.74). Liver Function Tests:  Recent Labs Lab 06/15/16 0855  AST 27  ALT 18  ALKPHOS 48  BILITOT 0.3  PROT 6.4*  ALBUMIN 3.6   No results for input(s): LIPASE, AMYLASE in the last 168 hours. No results for input(s): AMMONIA in the last 168 hours. Coagulation  Profile:  Recent Labs Lab 06/15/16 0855  INR 1.01   Cardiac Enzymes:  Recent Labs Lab 06/16/16 1807 06/16/16 2239 06/17/16 0403  TROPONINI 0.15* 0.17* 0.10*   BNP (last 3 results) No results for input(s): PROBNP in the last 8760 hours. HbA1C: No results for input(s): HGBA1C in the last 72 hours. CBG: No results for input(s): GLUCAP in the last 168 hours. Lipid Profile: No results for input(s): CHOL, HDL, LDLCALC, TRIG, CHOLHDL, LDLDIRECT in the last 72 hours. Thyroid Function Tests: No results for input(s): TSH, T4TOTAL, FREET4, T3FREE, THYROIDAB in the last 72 hours. Anemia Panel: No results for input(s): VITAMINB12, FOLATE, FERRITIN, TIBC, IRON, RETICCTPCT in the last 72 hours. Urine analysis:    Component Value Date/Time   COLORURINE YELLOW 06/15/2016 1254   APPEARANCEUR CLEAR 06/15/2016 1254   LABSPEC <1.005* 06/15/2016 1254   PHURINE 5.0 06/15/2016 1254   GLUCOSEU NEGATIVE 06/15/2016 1254   HGBUR LARGE* 06/15/2016 1254   BILIRUBINUR NEGATIVE 06/15/2016 1254   KETONESUR NEGATIVE 06/15/2016 1254   PROTEINUR NEGATIVE 06/15/2016 1254   UROBILINOGEN 0.2 07/09/2013 1050   NITRITE NEGATIVE 06/15/2016 1254   LEUKOCYTESUR NEGATIVE 06/15/2016 1254   Sepsis Labs: @LABRCNTIP (procalcitonin:4,lacticidven:4)   Recent Results (from the past 240 hour(s))  MRSA PCR Screening     Status: None   Collection Time: 06/16/16  8:30 PM  Result Value Ref Range Status   MRSA by PCR NEGATIVE NEGATIVE Final      Radiology Studies: Dg Forearm Left 06/15/2016  Significantly displaced distal radial metaphyseal fracture and distal ulnar diaphyseal fractures as described. Ulnar styloid fracture. Electronically Signed   By: Elberta Fortisaniel  Boyle M.D.   On: 06/15/2016 09:54   Dg Wrist 2 Views Left 06/15/2016  Significantly displaced distal radial metaphyseal fracture as well as displaced distal ulnar diaphyseal fracture as described. Displaced ulnar styloid fracture. Electronically Signed   By:  Elberta Fortisaniel  Boyle M.D.   On: 06/15/2016 09:52   Ct Head Wo Contrast 06/15/2016  No acute intracranial findings. Possible subtle fracture of the nasal bone which may be acute or chronic. Opacification in the ethmoid sinus. No acute cervical spine injury. Subtle spondylosis of the mid to lower cervical spine. Electronically Signed   By: Elberta Fortisaniel  Boyle M.D.   On: 06/15/2016 10:44   Ct Chest W Contrast 06/15/2016   1. T12 compression fracture as described. 2. No evidence for non bony acute traumatic organ injury in the chest, abdomen, or pelvis. No intraperitoneal free fluid. 3. Mild dilatation of the common bile duct without identifiable a etiology. No associated dilatation of the main pancreatic duct. Correlation with liver function test may prove  helpful. 4. 5 mm right middle lobe pulmonary nodule No follow-up needed if patient is low-risk. Non-contrast chest CT can be considered in 12 months if patient is high-risk. This recommendation follows the consensus statement: Guidelines for Management of Incidental Pulmonary Nodules Detected on CT Images:From the Fleischner Society 2017; published online before print (10.1148/[id number]). Electronically Signed   By: Kennith Center M.D.   On: 06/15/2016 11:08   Ct Cervical Spine Wo Contrast 06/15/2016  No acute intracranial findings. Possible subtle fracture of the nasal bone which may be acute or chronic. Opacification in the ethmoid sinus. No acute cervical spine injury. Subtle spondylosis of the mid to lower cervical spine. Electronically Signed   By: Elberta Fortis M.D.   On: 06/15/2016 10:44   Ct Abdomen Pelvis W Contrast 06/15/2016  1. T12 compression fracture as described. 2. No evidence for non bony acute traumatic organ injury in the chest, abdomen, or pelvis. No intraperitoneal free fluid. 3. Mild dilatation of the common bile duct without identifiable a etiology. No associated dilatation of the main pancreatic duct. Correlation with liver function test may  prove helpful. 4. 5 mm right middle lobe pulmonary nodule No follow-up needed if patient is low-risk. Non-contrast chest CT can be considered in 12 months if patient is high-risk. This recommendation follows the consensus statement: Guidelines for Management of Incidental Pulmonary Nodules Detected on CT Images:From the Fleischner Society 2017; published online before print (10.1148/[id number]). Electronically Signed   By: Kennith Center M.D.   On: 06/15/2016 11:08   Dg Chest Port 1 View 06/16/2016   1. Interval mild cardiomegaly. 2. Possible small right upper lung zone nodule. Follow-up PA and lateral chest radiographs are recommended when possible. Electronically Signed   By: Beckie Salts M.D.   On: 06/16/2016 17:27      Scheduled Meds: .  ceFAZolin (ANCEF) IV  1 g Intravenous Q8H  . docusate sodium  100 mg Oral BID  . gentamicin  500 mg Intravenous Q24H  . vitamin C  1,000 mg Oral Daily   Continuous Infusions:     LOS: 3 days    Time spent: 15 minutes  Greater than 50% of the time spent on counseling and coordinating the care.   Manson Passey, MD [facility] Pager [phone]  If 7PM-7AM, please contact night-coverage [email or web address] Password [facility] 06/18/2016, 8:36 AM

## 2016-07-27 ENCOUNTER — Ambulatory Visit: Payer: Self-pay | Admitting: Family Medicine

## 2016-12-19 IMAGING — CT CT HEAD W/O CM
5 of 8 series · 17 of 47 positions shown, 18 images · non-contrast
Comparison: None.

CLINICAL DATA: Fall from roof this morning.

EXAM:
CT HEAD WITHOUT CONTRAST
CT CERVICAL SPINE WITHOUT CONTRAST
TECHNIQUE: Multidetector CT imaging of the head and cervical spine was
performed following the standard protocol without intravenous
contrast. Multiplanar CT image reconstructions of the cervical spine
were also generated.

[Series 3: head without · axial · non-contrast · 0.43mm/px · z∈[-100,+70]mm · 3 of 35 slices shown, 4 images]
[im 1/35  brain]
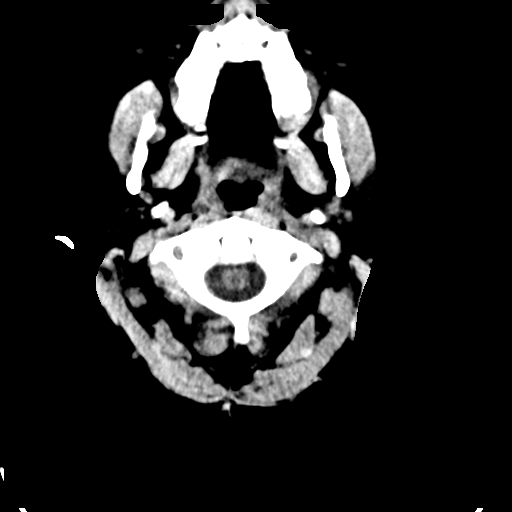
[im 1/35  bone]
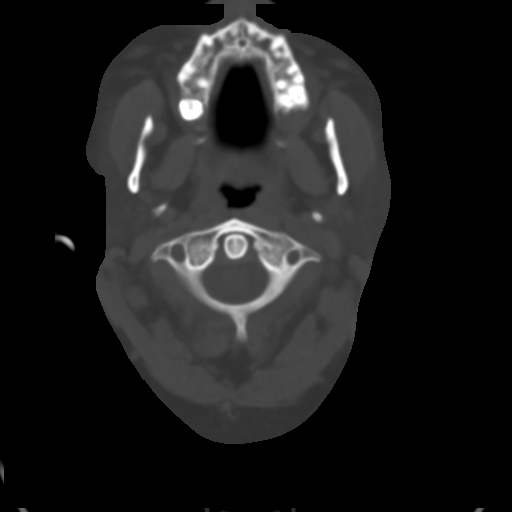
[im 18/35  brain]
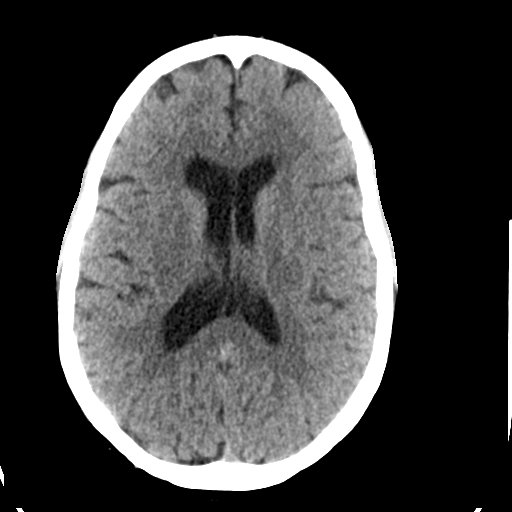
[im 35/35  brain]
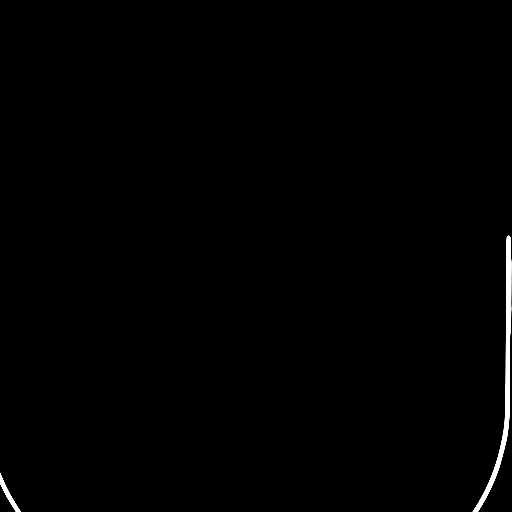

[Series 4: head bone · axial · 0.43mm/px · z∈[-76,+44]mm · 6 of 86 slices shown]
[im 13/86  bone]
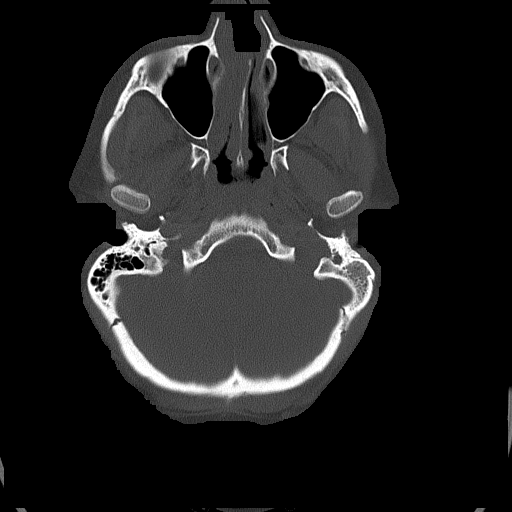
[im 25/86  bone]
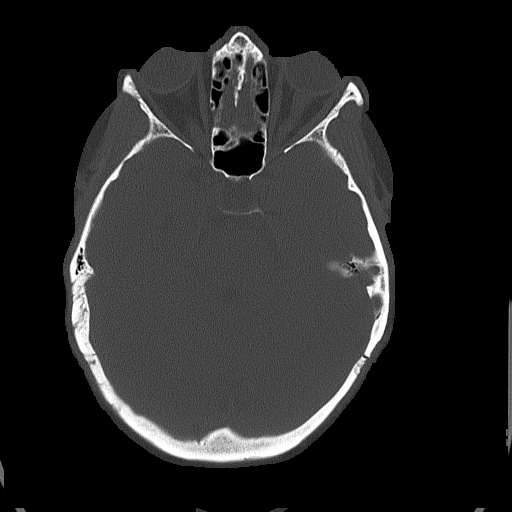
[im 37/86  bone]
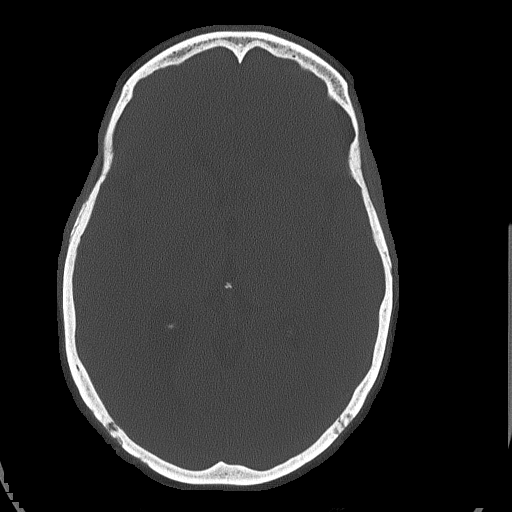
[im 49/86  bone]
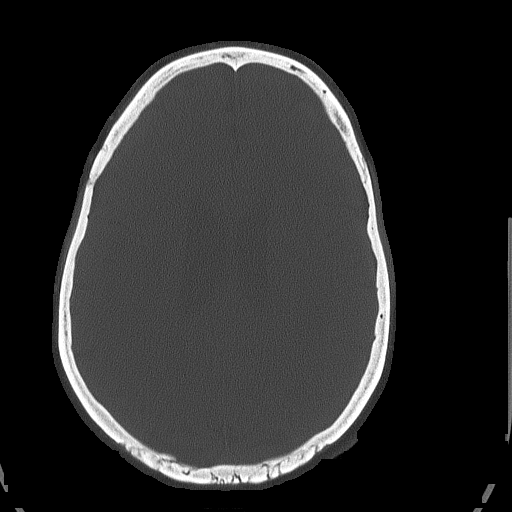
[im 61/86  bone]
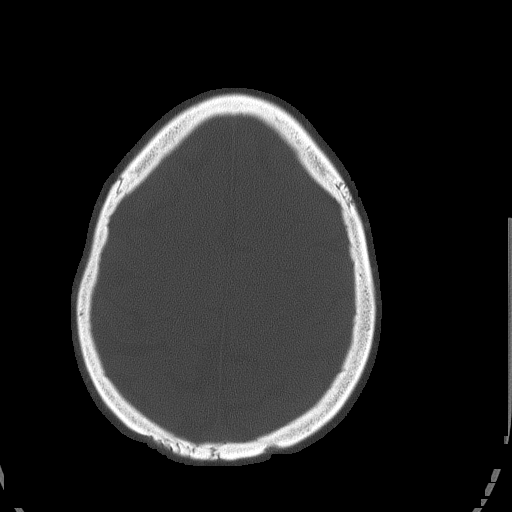
[im 73/86  bone]
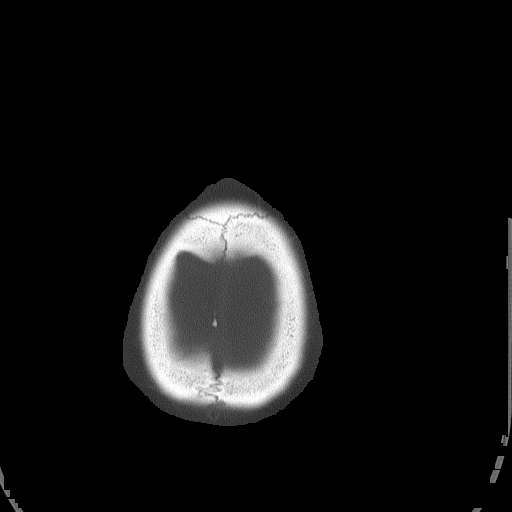

[Series 5: head without cor · coronal · non-contrast · 0.33mm/px · 3 of 66 slices shown]
[im 17/66  brain]
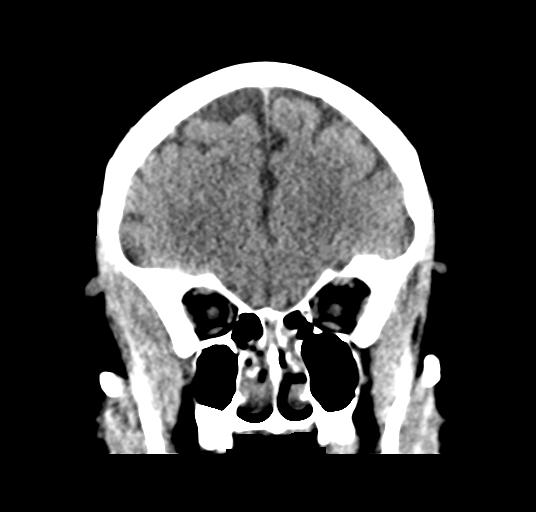
[im 33/66  brain]
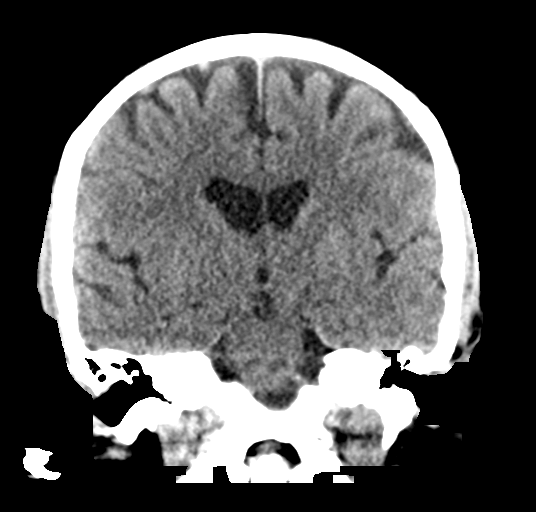
[im 49/66  brain]
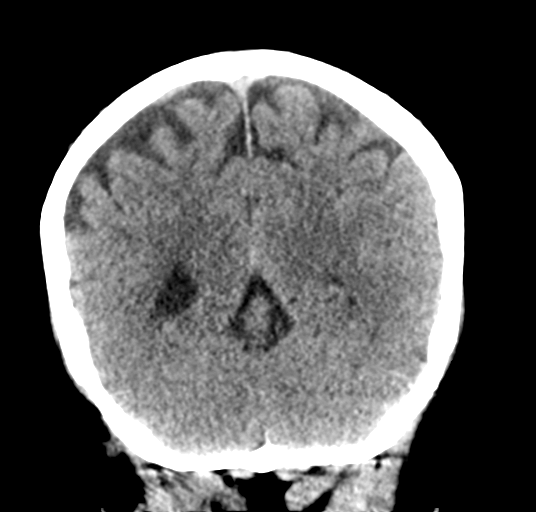

[Series 6: head without sag · sagittal · non-contrast · 0.33mm/px · 2 of 67 slices shown]
[im 23/67  brain]
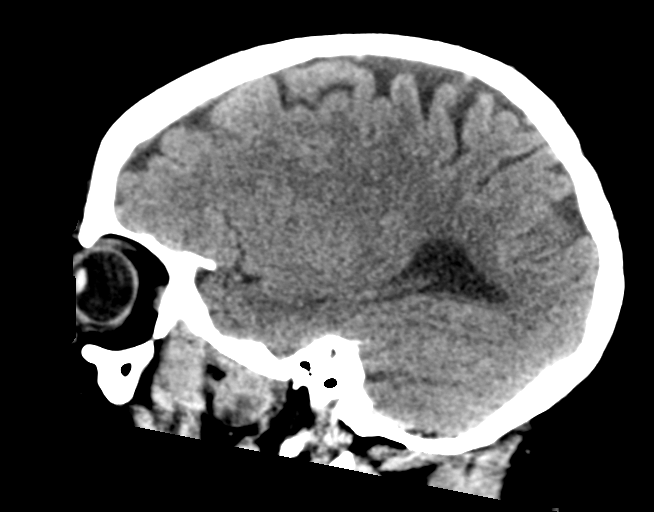
[im 45/67  brain]
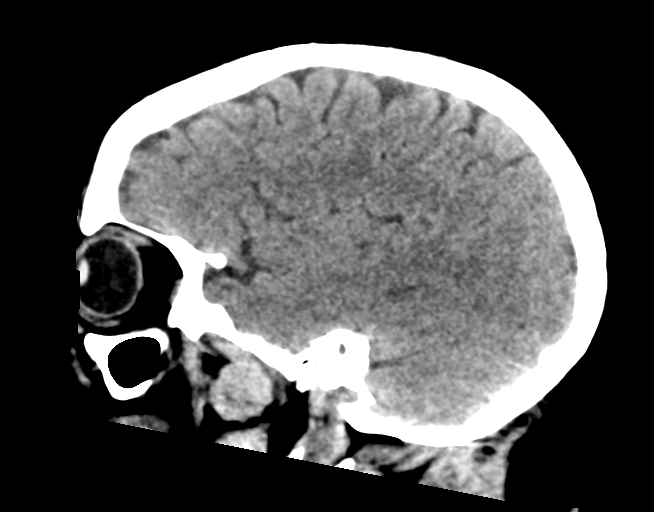

[Series 7: c_spine 2.0 st · axial · 0.32mm/px · z∈[-255,-209]mm · 3 of 104 slices shown]
[im 12/104  brain]
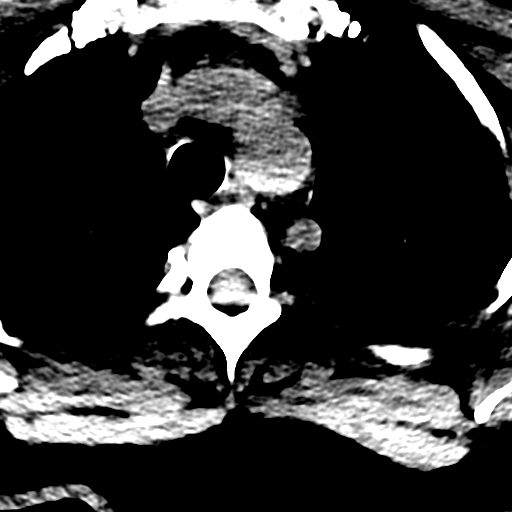
[im 23/104  brain]
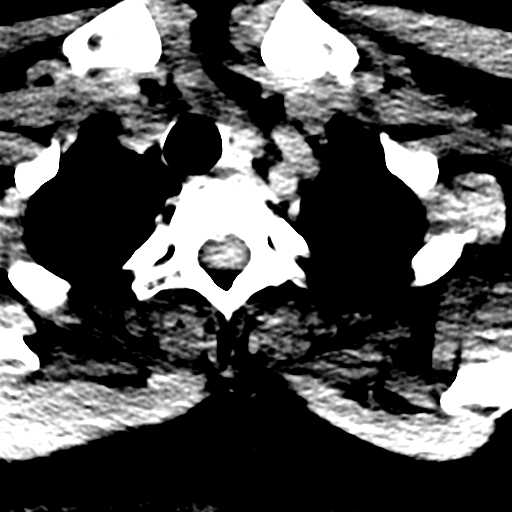
[im 35/104  brain]
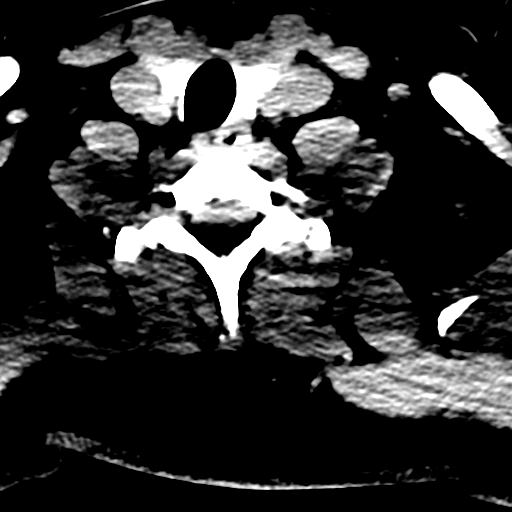

[17 of 47 positions shown; findings below may reference images not displayed]

FINDINGS: CT HEAD FINDINGS

Ventricles, cisterns and other CSF spaces are within normal. There
is no mass, mass effect, shift of midline structures or acute
hemorrhage. No evidence of acute infarction. Subtle opacification
over the ethmoid sinus. Possible subtle fracture of the nasal bone
which may be acute or chronic. Deviation of the nasal septum to the
left.

CT CERVICAL SPINE FINDINGS

Vertebral body alignment, heights and disc space heights are within
normal. Very minimal spondylosis of the mid to lower cervical spine.
Prevertebral soft tissues are normal. The atlantoaxial articulation
is normal. Minimal uncovertebral joint spurring. No acute fracture
or subluxation. Remainder of the exam is within normal.
IMPRESSION: No acute intracranial findings.

Possible subtle fracture of the nasal bone which may be acute or
chronic. Opacification in the ethmoid sinus.

No acute cervical spine injury.

Subtle spondylosis of the mid to lower cervical spine.

## 2016-12-20 IMAGING — CR DG CHEST 1V PORT
1 series · 1 of 1 positions shown · non-contrast
Comparison: 04/20/2010.

CLINICAL DATA: Status post cardiac arrest this morning.

EXAM:
PORTABLE CHEST 1 VIEW

[AP]
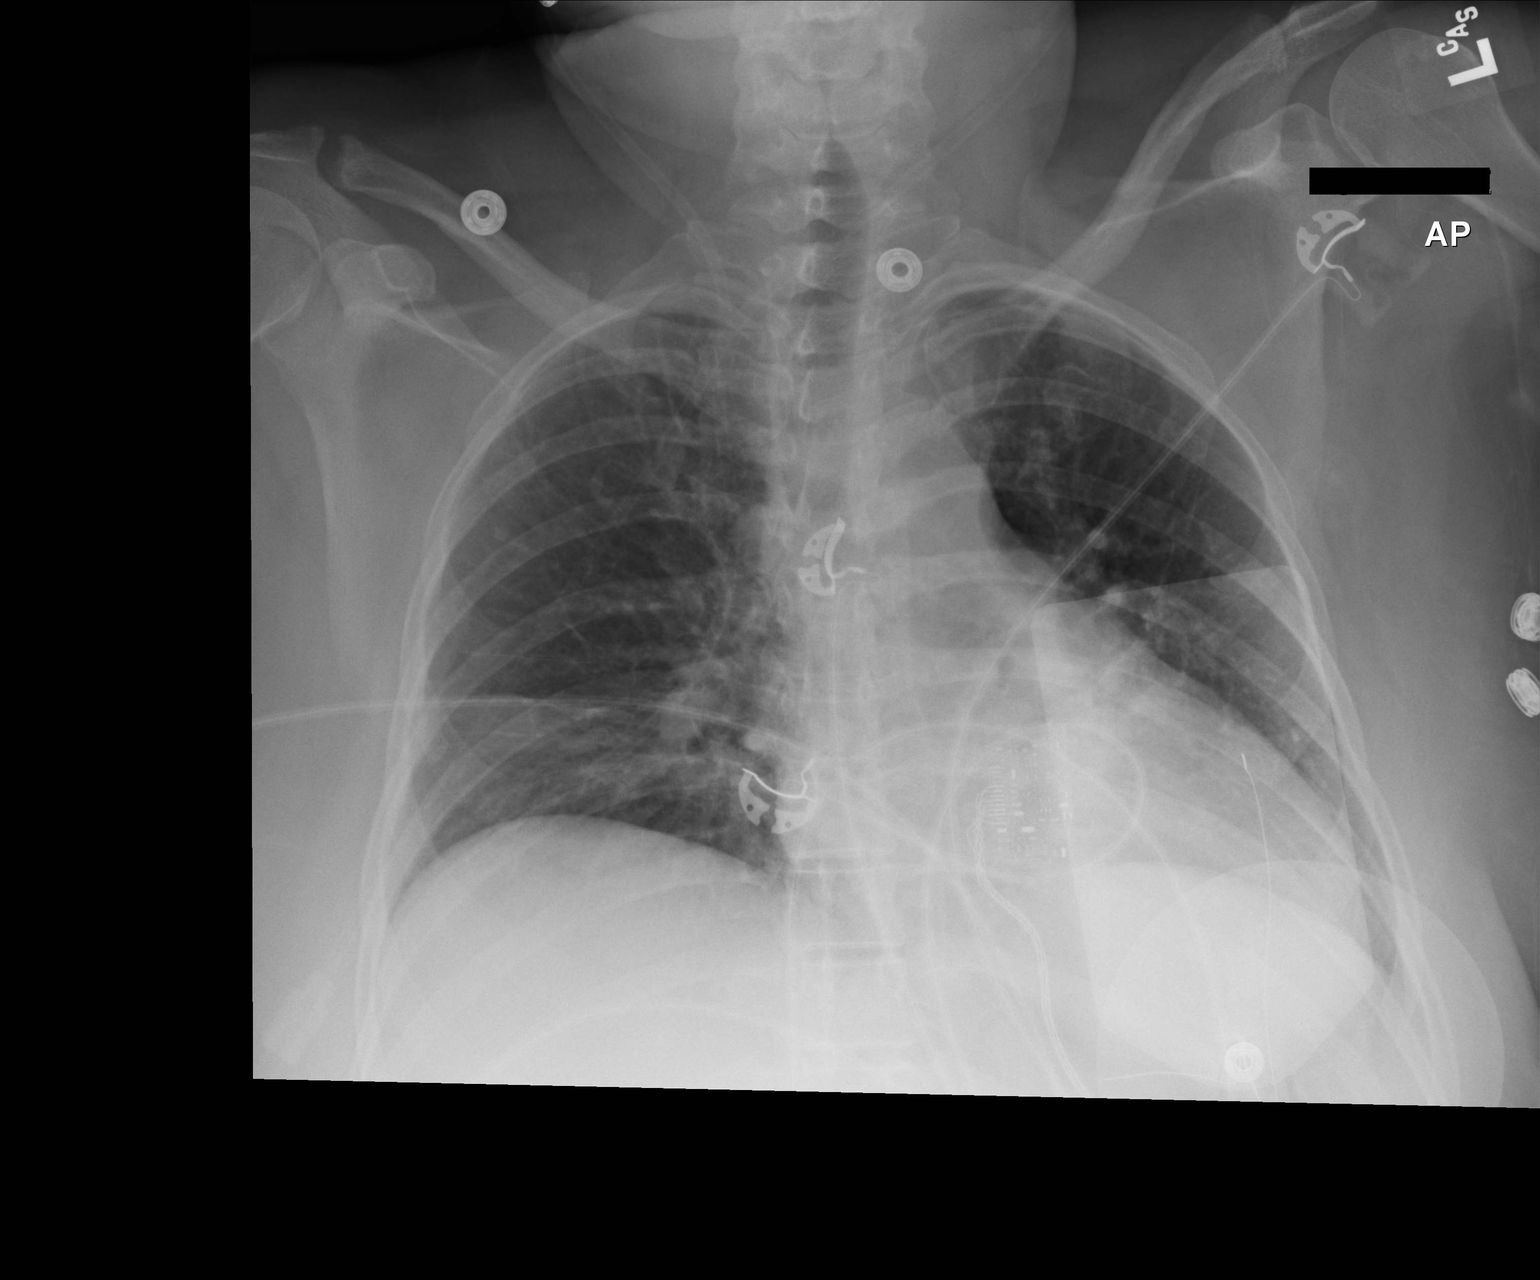

[1 of 1 positions shown; findings below may reference images not displayed]

FINDINGS: Interval enlargement of the cardiac silhouette, magnified by a poor
inspiration and portable AP technique. Clear lungs with normal
vasculature. Possible small nodule in the right upper lung zone,
laterally. Unremarkable bones.
IMPRESSION: 1. Interval mild cardiomegaly.
2. Possible small right upper lung zone nodule. Follow-up PA and
lateral chest radiographs are recommended when possible.

## 2017-04-29 DEATH — deceased
